# Patient Record
Sex: Female | Born: 1990 | Race: Black or African American | Hispanic: No | Marital: Single | Smoking: Current every day smoker
Health system: Southern US, Community
[De-identification: ages and names within clinical notes are randomized; demographics above are authoritative.]

## PROBLEM LIST (undated history)

## (undated) ENCOUNTER — Inpatient Hospital Stay (HOSPITAL_COMMUNITY): Payer: Self-pay

## (undated) DIAGNOSIS — F419 Anxiety disorder, unspecified: Secondary | ICD-10-CM

## (undated) DIAGNOSIS — Z91018 Allergy to other foods: Secondary | ICD-10-CM

## (undated) DIAGNOSIS — T7840XA Allergy, unspecified, initial encounter: Secondary | ICD-10-CM

## (undated) DIAGNOSIS — F32A Depression, unspecified: Secondary | ICD-10-CM

## (undated) DIAGNOSIS — J45909 Unspecified asthma, uncomplicated: Secondary | ICD-10-CM

## (undated) DIAGNOSIS — K219 Gastro-esophageal reflux disease without esophagitis: Secondary | ICD-10-CM

## (undated) HISTORY — DX: Allergy to other foods: Z91.018

## (undated) HISTORY — DX: Gastro-esophageal reflux disease without esophagitis: K21.9

## (undated) HISTORY — DX: Anxiety disorder, unspecified: F41.9

## (undated) HISTORY — DX: Depression, unspecified: F32.A

## (undated) HISTORY — DX: Allergy, unspecified, initial encounter: T78.40XA

---
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
## 2013-07-23 ENCOUNTER — Emergency Department (HOSPITAL_COMMUNITY)
Admission: EM | Admit: 2013-07-23 | Discharge: 2013-07-23 | Disposition: A | Discharge status: Home / Self Care | Payer: Self-pay | Attending: Emergency Medicine | Admitting: Emergency Medicine

## 2013-07-23 ENCOUNTER — Encounter (HOSPITAL_COMMUNITY): Payer: Self-pay | Admitting: Emergency Medicine

## 2013-07-23 DIAGNOSIS — Z79899 Other long term (current) drug therapy: Secondary | ICD-10-CM | POA: Insufficient documentation

## 2013-07-23 DIAGNOSIS — J029 Acute pharyngitis, unspecified: Secondary | ICD-10-CM | POA: Insufficient documentation

## 2013-07-23 LAB — RAPID STREP SCREEN (MED CTR MEBANE ONLY): Streptococcus, Group A Screen (Direct): NEGATIVE

## 2013-07-23 MED ORDER — IBUPROFEN 600 MG PO TABS: 600.0000 mg | ORAL_TABLET | Freq: Four times a day (QID) | ORAL | Status: DC | PRN

## 2013-07-23 MED ORDER — LIDOCAINE VISCOUS 2 % MT SOLN
15.0000 mL | Freq: Once | OROMUCOSAL | Status: AC
  Administered 2013-07-23: 15 mL via OROMUCOSAL
  Filled 2013-07-23: qty 15

## 2013-07-23 MED ORDER — LIDOCAINE VISCOUS 2 % MT SOLN: 20.0000 mL | OROMUCOSAL | Status: DC | PRN

## 2013-07-23 NOTE — Discharge Instructions (Signed)
Please follow up with your primary care physician in 1-2 days. If you do not have one please call the [facility] number listed above. Please alternate between Motrin and Tylenol every three hours for fevers and pain. Please read all discharge instructions and return precautions.   Viral Pharyngitis Viral pharyngitis is a viral infection that produces redness, pain, and swelling (inflammation) of the throat. It can spread from person to person (contagious). CAUSES Viral pharyngitis is caused by inhaling a large amount of certain germs called viruses. Many different viruses cause viral pharyngitis. SYMPTOMS Symptoms of viral pharyngitis include:  Sore throat.  Tiredness.  Stuffy nose.  Low-grade fever.  Congestion.  Cough. TREATMENT Treatment includes rest, drinking plenty of fluids, and the use of over-the-counter medication (approved by your caregiver). HOME CARE INSTRUCTIONS   Drink enough fluids to keep your urine clear or pale yellow.  Eat soft, cold foods such as ice cream, frozen ice pops, or gelatin dessert.  Gargle with warm salt water (1 tsp salt per 1 qt of water).  If over age 267, throat lozenges may be used safely.  Only take over-the-counter or prescription medicines for pain, discomfort, or fever as directed by your caregiver. Do not take aspirin. To help prevent spreading viral pharyngitis to others, avoid:  Mouth-to-mouth contact with others.  Sharing utensils for eating and drinking.  Coughing around others. SEEK MEDICAL CARE IF:   You are better in a few days, then become worse.  You have a fever or pain not helped by pain medicines.  There are any other changes that concern you. Document Released: 10/23/2004 Document Revised: 04/07/2011 Document Reviewed: 03/21/2010 [facility] Patient Information 2015 [place]. This information is not intended to replace advice given to you by your health care provider. Make sure you discuss  any questions you have with your health care provider.

## 2013-07-23 NOTE — ED Notes (Signed)
Pt presents to department for evaluation of sore throat x2 weeks. Respirations unlabored. NAD.

## 2013-07-23 NOTE — ED Provider Notes (Signed)
CSN: [id number]     Arrival date & time 07/23/13  1339 History  This chart was scribed for non-physician practitioner, Francee PiccoloJennifer Audria Takeshita, PA-C working with Richardean Canalavid H Yao, MD by Luisa DagoPriscilla Tutu, ED scribe. This patient was seen in room TR08C/TR08C and the patient's care was started at 3:40 PM.    Chief Complaint  Patient presents with  . Sore Throat    The history is provided by the patient. No language interpreter was used.   HPI Comments: Sharon Stone is a 23 y.o. female who presents to the Emergency Department complaining of a worsening sore throat that started 1.5 weeks ago. She is also complaining of associated rhinorrhea but she states that that is normal for her. Pt is also complaining of painful swallowing. She denies any abdominal pain, sick contact, congestion, fever, chills, nausea, or emesis.   History reviewed. No pertinent past medical history. History reviewed. No pertinent past surgical history. No family history on file. History  Substance Use Topics  . Smoking status: Never Smoker   . Smokeless tobacco: Not on file  . Alcohol Use: No   OB History   Grav Para Term Preterm Abortions TAB SAB Ect Mult Living                 Review of Systems  Constitutional: Negative for fever and chills.  HENT: Positive for rhinorrhea and sore throat. Negative for congestion.   Respiratory: Negative for cough and shortness of breath.   Cardiovascular: Negative for chest pain.  Gastrointestinal: Negative for nausea and vomiting.  All other systems reviewed and are negative.  Allergies  Shellfish allergy  Home Medications   Prior to Admission medications   Medication Sig Start Date End Date Taking? Authorizing Provider  acetaminophen (TYLENOL) 500 MG tablet Take 500 mg by mouth daily as needed for mild pain.   Yes Historical Provider, MD  Multiple Vitamins-Minerals (MULTIVITAMIN PO) Take 1 tablet by mouth daily.   Yes Historical Provider, MD  ibuprofen (ADVIL,MOTRIN) 600  MG tablet Take 1 tablet (600 mg total) by mouth every 6 (six) hours as needed. 07/23/13   Alliene Klugh L Lynnette Pote, PA-C  lidocaine (XYLOCAINE) 2 % solution Use as directed 20 mLs in the mouth or throat as needed for mouth pain. 07/23/13   Alesha Jaffee L Janesia Joswick, PA-C   BP 102/52  Pulse 106  Temp(Src) 98.2 F (36.8 C) (Oral)  Resp 18  SpO2 99%  Physical Exam  Nursing note and vitals reviewed. Constitutional: She is oriented to person, place, and time. She appears well-developed and well-nourished. No distress.  HENT:  Head: Normocephalic and atraumatic.  Right Ear: External ear normal.  Left Ear: External ear normal.  Nose: Nose normal.  Mouth/Throat: Uvula is midline and mucous membranes are normal. No trismus in the jaw. No uvula swelling. Oropharyngeal exudate and posterior oropharyngeal erythema present. No posterior oropharyngeal edema or tonsillar abscesses.  Eyes: Conjunctivae are normal.  Neck: Normal range of motion. Neck supple.  Cardiovascular: Normal rate, regular rhythm and normal heart sounds.   Pulmonary/Chest: Effort normal and breath sounds normal. No respiratory distress. She has no wheezes. She has no rales.  Abdominal: Soft. There is no tenderness.  Musculoskeletal: Normal range of motion.  Lymphadenopathy:    She has cervical adenopathy.  Neurological: She is alert and oriented to person, place, and time.  Skin: Skin is warm and dry. She is not diaphoretic.  Psychiatric: She has a normal mood and affect.    ED Course  Procedures (including  critical care time)  DIAGNOSTIC STUDIES: Oxygen Saturation is 99% on RA, normal by my interpretation.    COORDINATION OF CARE: 3:43 PM- Pt advised of plan for treatment and pt agrees.  Labs Review Labs Reviewed  RAPID STREP SCREEN  CULTURE, GROUP A STREP    Imaging Review No results found.   EKG Interpretation None      MDM   Final diagnoses:  Viral pharyngitis    Filed Vitals:   07/23/13 1601  BP:  116/64  Pulse: 90  Temp:   Resp:    Afebrile, NAD, non-toxic appearing, AAOx4.   Pt afebrile with tonsillar exudate, negative strep. Presents with mild cervical lymphadenopathy, & dysphagia; diagnosis of viral pharyngitis. No abx indicated. DC w symptomatic tx for pain  Pt does not appear dehydrated, but did discuss importance of water rehydration. Presentation non concerning for PTA or infxn spread to soft tissue. No trismus or uvula deviation. Specific return precautions discussed. Pt able to drink water in ED without difficulty with intact air way. Recommended PCP follow up. Patient is stable at time of discharge.   I personally performed the services described in this documentation, which was scribed in my presence. The recorded information has been reviewed and is accurate.    Jeannetta EllisJennifer L Eliyanna Ault, PA-C 07/23/13 1619

## 2013-07-25 LAB — CULTURE, GROUP A STREP

## 2013-07-26 NOTE — ED Provider Notes (Signed)
Medical screening examination/treatment/procedure(s) were performed by non-physician practitioner and as supervising physician I was immediately available for consultation/collaboration.   EKG Interpretation None        Richardean Canalavid H Yao, MD 07/26/13 [phone]

## 2013-11-25 ENCOUNTER — Encounter (HOSPITAL_COMMUNITY): Payer: Self-pay | Admitting: Emergency Medicine

## 2013-11-25 ENCOUNTER — Emergency Department (HOSPITAL_COMMUNITY)
Admission: EM | Admit: 2013-11-25 | Discharge: 2013-11-25 | Disposition: A | Discharge status: Home / Self Care | Payer: Self-pay | Attending: Emergency Medicine | Admitting: Emergency Medicine

## 2013-11-25 DIAGNOSIS — Z3202 Encounter for pregnancy test, result negative: Secondary | ICD-10-CM | POA: Insufficient documentation

## 2013-11-25 DIAGNOSIS — L0291 Cutaneous abscess, unspecified: Secondary | ICD-10-CM

## 2013-11-25 DIAGNOSIS — L0231 Cutaneous abscess of buttock: Secondary | ICD-10-CM | POA: Insufficient documentation

## 2013-11-25 LAB — POC URINE PREG, ED
Preg Test, Ur: NEGATIVE
Preg Test, Ur: NEGATIVE

## 2013-11-25 MED ORDER — HYDROCODONE-ACETAMINOPHEN 5-325 MG PO TABS: 1.0000 | ORAL_TABLET | Freq: Four times a day (QID) | ORAL | Status: DC | PRN

## 2013-11-25 MED ORDER — CEPHALEXIN 500 MG PO CAPS: 500.0000 mg | ORAL_CAPSULE | Freq: Four times a day (QID) | ORAL | Status: DC

## 2013-11-25 MED ORDER — LIDOCAINE HCL 2 % IJ SOLN
5.0000 mL | Freq: Once | INTRAMUSCULAR | Status: AC
  Administered 2013-11-25: 100 mg
  Filled 2013-11-25: qty 20

## 2013-11-25 NOTE — ED Notes (Signed)
Pt reports having abscess to buttock x 4 days that is causing pain. No acute distress noted at triage.

## 2013-11-25 NOTE — ED Notes (Addendum)
Pt noticed a bump on bottom in the shower. Pt states it is sore. Pt noticed it today in the mirror and it was bleeding. Abscess felt on right cheek. Hard upon palpation and pt states very tender to touch. Measured 4.5 cm by 3.5 cm. Small area draining.

## 2013-11-25 NOTE — Discharge Instructions (Signed)
Please call your doctor for a followup appointment within 24-48 hours. When you talk to your doctor please let them know that you were seen in the emergency department and have them acquire all of your records so that they can discuss the findings with you and formulate a treatment plan to fully care for your new and ongoing problems. Please rest and stay hydrated Please apply warm compressions to the site at least 4-5 times per day Please take antibiotics as prescribed and on a full stomach  Please take medications as prescribed - while on pain medications there is to be no drinking alcohol, driving, operating any heavy machinery. If extra please dispose in a proper manner. Please do not take any extra Tylenol with this medication for this can lead to Tylenol overdose and liver issues.  Please report back to the Emergency Department in 2 days for wound to be re-assessed Please continue to monitor symptoms closely and if symptoms are to worsen or change (fever greater than 101, chills, sweating, nausea, vomiting, chest pain, shortness of breathe, difficulty breathing, weakness, numbness, tingling, worsening or changes to pain pattern, red streaks running down the leg, increased pain, numbness, tingling, loss of sensation) please report back to the Emergency Department immediately.    Abscess An abscess is an infected area that contains a collection of pus and debris.It can occur in almost any part of the body. An abscess is also known as a furuncle or boil. CAUSES  An abscess occurs when tissue gets infected. This can occur from blockage of oil or sweat glands, infection of hair follicles, or a minor injury to the skin. As the body tries to fight the infection, pus collects in the area and creates pressure under the skin. This pressure causes pain. People with weakened immune systems have difficulty fighting infections and get certain abscesses more often.  SYMPTOMS Usually an abscess develops on the  skin and becomes a painful mass that is red, warm, and tender. If the abscess forms under the skin, you may feel a moveable soft area under the skin. Some abscesses break open (rupture) on their own, but most will continue to get worse without care. The infection can spread deeper into the body and eventually into the bloodstream, causing you to feel ill.  DIAGNOSIS  Your caregiver will take your medical history and perform a physical exam. A sample of fluid may also be taken from the abscess to determine what is causing your infection. TREATMENT  Your caregiver may prescribe antibiotic medicines to fight the infection. However, taking antibiotics alone usually does not cure an abscess. Your caregiver may need to make a small cut (incision) in the abscess to drain the pus. In some cases, gauze is packed into the abscess to reduce pain and to continue draining the area. HOME CARE INSTRUCTIONS   Only take over-the-counter or prescription medicines for pain, discomfort, or fever as directed by your caregiver.  If you were prescribed antibiotics, take them as directed. Finish them even if you start to feel better.  If gauze is used, follow your caregiver's directions for changing the gauze.  To avoid spreading the infection:  Keep your draining abscess covered with a bandage.  Wash your hands well.  Do not share personal care items, towels, or whirlpools with others.  Avoid skin contact with others.  Keep your skin and clothes clean around the abscess.  Keep all follow-up appointments as directed by your caregiver. SEEK MEDICAL CARE IF:   You  have increased pain, swelling, redness, fluid drainage, or bleeding.  You have muscle aches, chills, or a general ill feeling.  You have a fever. MAKE SURE YOU:   Understand these instructions.  Will watch your condition.  Will get help right away if you are not doing well or get worse. Document Released: 10/23/2004 Document Revised:  07/15/2011 Document Reviewed: 03/28/2011 [facility] Patient Information 2015 [place], [place]. This information is not intended to replace advice given to you by your health care provider. Make sure you discuss any questions you have with your health care provider.   Emergency Department Resource Guide 1) Find a Doctor and Pay Out of Pocket Although you won't have to find out who is covered by your insurance plan, it is a good idea to ask around and get recommendations. You will then need to call the office and see if the doctor you have chosen will accept you as a new patient and what types of options they offer for patients who are self-pay. Some doctors offer discounts or will set up payment plans for their patients who do not have insurance, but you will need to ask so you aren't surprised when you get to your appointment.  2) Contact Your Local Health Department Not all health departments have doctors that can see patients for sick visits, but many do, so it is worth a call to see if yours does. If you don't know where your local health department is, you can check in your phone book. The CDC also has a tool to help you locate your state's health department, and many state websites also have listings of all of their local health departments.  3) Find a Walk-in Clinic If your illness is not likely to be very severe or complicated, you may want to try a walk in clinic. These are popping up all over the country in pharmacies, drugstores, and shopping centers. They're usually staffed by nurse practitioners or physician assistants that have been trained to treat common illnesses and complaints. They're usually fairly quick and inexpensive. However, if you have serious medical issues or chronic medical problems, these are probably not your best option.  No Primary Care Doctor: - Call Health Connect at  [phone] - they can help you locate a primary care doctor that  accepts your insurance, provides certain  services, etc. - Physician Referral Service- [phone]  Chronic Pain Problems: Organization         Address  Phone   Notes  [facility]  [phone] Patients need to be referred by their primary care doctor.   Medication Assistance: Organization         Address  Phone   Notes  [facility] Medication [facility] [address] [place], [place] [address] [phone] --Must be a resident of [facility] [place] -- Must have NO insurance coverage whatsoever (no Medicaid/ Medicare, etc.) -- The pt. MUST have a primary care doctor that directs their care regularly and follows them in the community   Med[facility]ist  [phone]   [employer]  [phone]    Agencies that provide inexpensive medical care: Organization         Address  Phone   Notes  [facility]  [phone]   [facility]    [phone]   [facility] [address] [place], [place] [address] [phone]   [facility] [name],  [place] [phone]) [phone]   Planned Parenthood    [phone]) [phone]   [facility]    [phone]) [phone]   [facility] and [facility]  [address], [place] Phone:  [phone]) [phone], Fax:  [phone] Hours of Operation:  9 am - 6 pm, M-F.  Also accepts Medicaid/Medicare and self-pay.  [facility]  [address], [place] Phone: [phone], Fax: [phone]. Hours of Operation:  8:30 am - 5:30 pm, M-F.  Also accepts Medicaid and self-pay.  [facility] [place] [address], [place] Phone: [phone]) [phone]   [facility] [address], [place], [place] [phone], Ext. [phone] Mondays & Thursdays: 7-9 AM.  First 15 patients are seen on a first come, first serve basis.    Medicaid-accepting [facility] Providers:  Organization         Address  Phone   Notes  [facility] [address], [place] [phone] Also accepts self-pay patients.  [facility] [address] [place], [place]  [phone]) [phone]   [facility] [address], [place] [phone]) [phone]   [facility] [address] [place] [address], [place] [phone]   Renaye RakersVeita Bland [address], [place]   [phone] Only accepts [place] Access [place] patients after they have their name applied to their card.   Self-Pay (no insurance) in [facility]:  Organization         Address  Phone   Notes  Sickle Cell Patients, [facility] [address], [place] [phone]   [facility] [address], [place] [phone]   [facility] [place]  [address], [place] [phone]   [facility]/Dr. Osei-Bonsu  [address] [place] [address], [place] or [phone], [place] [phone] Phone number for both [place] and [place] locations is the same.  [facility] [address], [place] [phone]   [facility] [place] [address] [place] [address], [place] or [address] [phone]   [facility] [address] [place] [address], [place] [phone]) [phone], phone; [phone], fax Sees patients 1st and 3rd Saturday of every month.  Must not qualify for public or private insurance (i.e. Medicaid, Medicare, [place] Health Choice, Veterans' Benefits)  Household income should be no more than 200% of the poverty level The clinic cannot treat you if you are pregnant or think you are pregnant  Sexually transmitted diseases are not treated at the clinic.    Dental Care: Organization         Address  Phone  Notes  [facility] Department of [facility] [address], [place] [phone]) [phone] Accepts  children up to age 21 who are enrolled in [place] or [place] Health Choice; pregnant women with a Medicaid card; and children who have applied for Medicaid or [place] Health Choice, but were declined, whose parents can pay a reduced fee at time of service.  [facility] of [facility] [place]  [address], [place] [phone] Accepts children up to age 321 who are enrolled in [place] or [place] Health Choice; pregnant women with a Medicaid card; and children who have applied for Medicaid or   Health Choice, but were declined, whose parents can pay a reduced fee at time of service.  [facility]  [address] [place], [place] [phone] Patients are seen by appointment only. Walk-ins are not accepted. [facility] will see patients 69 years of age and older. Monday - Tuesday (8am-5pm) Most Wednesdays (8:30-5pm) $30 per visit, cash only  [facility] Access PRO[facility]  [address], [facility] [phone] Patients are seen by appointment only. Walk-ins are not accepted. [facility] will see patients 60 years of age and older. One Wednesday Evening (Monthly: Volunteer Based).  $30 per visit, cash only  [employer]  [phone] for adults; Children under age 23, call Graduate Pediatric Dentistry at [phone]. Children aged 11-14, please call [phone] to request a pediatric application.  Dental services are provided in all areas of dental care including fillings, crowns and bridges, complete and partial dentures, implants, gum treatment, root canals, and extractions. Preventive care is also provided. Treatment is provided to both adults and children. Patients are selected via a lottery and there is often a waiting list.   [facility] [address], [place]  [phone] [email or web address]   [facility] [address] [place], [place] [phone], Ext. [phone] Second and  Fourth Thursday of each month, opens at 6:30 AM; Clinic ends at 9 AM.  Patients are seen on a first-come first-served basis, and a limited number are seen during each clinic.   [facility]  [address] [place], [place] [phone]   Eligibility Requirements You must have lived in [place], [place], or [place] counties for at least the last three months.   You cannot be eligible for state or federal sponsored [place], including [employer], [place], or [employer].   You generally cannot be eligible for healthcare insurance through your employer.    How to apply: Eligibility screenings are held every Tuesday and Wednesday afternoon from 1:00 pm until 4:00 pm. You do not need an appointment for the interview!  [facility] [address] [place], [place] [phone]   [facility] Health Department  [phone]   [facility] Health Department  [phone]   [facility] Health Department  [phone]    Behavioral Health Resources in the Community: Intensive Outpatient Programs Organization         Address  Phone  Notes  [facility] [address] [place], [place] [phone]   [facility] Outpatient [address] [place], [place] [phone]   [facility]: Alcohol [facility] Drug Svcs [address] [place], [place]  [phone]   [facility] Mental Health [address],  [place], [place] [phone] or [phone]   Substance Abuse Resources Organization         Address  Phone  Notes  [facility] Drug Services  [phone]   [facility]  [phone]   [facility] [place]  [phone]   Floydene Flock  [phone]   Residential & Outpatient Substance Abuse Program  [phone]   Psychological Services Organization         Address  Phone  Notes  [facility] Health  [phone]   [facility]  [phone]   [facility] Mental Health  [address] [place] [phone] or [phone]    Mobile Crisis Teams Organization         Address  Phone  Notes  [facility], Mobile Crisis Care Unit  [phone]   [facility]  [address] [place], [place] [phone]   [facility] [address] [place] [phone]    Self-Help/Support Groups Organization         Address  Phone             Notes  [facility] of [place] - variety of support groups  [phone]- I7437963 Call for more information  Narcotics Anonymous (NA), [facility] [address], [employer] [place]  2 meetings at this location   Statistician         Address  Phone  Notes  [facility] [address],    [place]  [phone]   [facility]  [address] [place] [address], [place], [place] [phone]   [facility] [address] [place], [place] [phone] Admissions: 8am-3pm M-F  [facility] [address],    [place], [place] [phone]   [facility] [address] [place], [place], [place] [phone]   [facility] [address]  [place], [place] [phone]   [facility] - Intensive Outpatient [address], [place], [place] [phone]   [facility] ([facility] [address] [place].,  [place], [place] [phone] or [phone]   [facility] ([facility]) [address], [place], [place] [phone] Accepts Medicaid  [facility] [address],  [place] [phone] Substance Abuse/Addiction Treatment   [facility] Organization         Address  Phone  Notes  [facility]  [phone]   Angie Fava, PhD [address] [place], [place]   ([phone] or [phone]   [facility]al   [address] [place], [place] [phone]   [facility] [address] [place], [place] [phone] Insurance/Medicaid/sponsorship through [facility] and Families [address] [place] [address]                                    [place], [place] [phone] Therapy/tele-psych/case  [facility] [address], [place] [phone]    Dr. Lolly Mustache  [phone]   [facility] [place]  [employer] [facility] Dept. 1) [address], [place] 2) [address] [place] 3)  [address], [place] [phone]  [phone]   [facility] Child Abuse Hotline 310-879-2267 or [phone]) [phone] (After Hours)

## 2013-11-25 NOTE — ED Provider Notes (Signed)
Medical screening examination/treatment/procedure(s) were performed by non-physician practitioner and as supervising physician I was immediately available for consultation/collaboration.   EKG Interpretation None       Doug SouSam Katharin Schneider, MD 11/25/13 2259

## 2013-11-25 NOTE — ED Notes (Addendum)
PA at bedside draining abscess. Pt reports some relief.

## 2013-11-25 NOTE — ED Provider Notes (Signed)
CSN: [id number]     Arrival date & time 11/25/13  1528 History   First MD Initiated Contact with Patient 11/25/13 1609     Chief Complaint  Patient presents with  . Abscess     (Consider location/radiation/quality/duration/timing/severity/associated sxs/prior Treatment) Patient is a 23 y.o. female presenting with abscess. The history is provided by the patient. No language interpreter was used.  Abscess Associated symptoms: no fever, no headaches, no nausea and no vomiting   [name] is a 23 y/o F with no known significant PMHx presenting to the ED with abscess to the right buttock that started 4 days ago. Patient reported that there is tenderness upon palpation to the area along with sitting for long periods of time. Patient reported that the size of the abscess has stayed the same. Patient reported that she placed a hot rag on the site last evening for a couple of minutes. Reported that this afternoon she noticed drainage of blood and pus. Denied chest pain, shortness of breath, difficulty breathing, fever, chills, numbness, tingling, loss of sensation, neck pain, neck stiffness, pruritus, shaving.  PCP none  History reviewed. No pertinent past medical history. History reviewed. No pertinent past surgical history. History reviewed. No pertinent family history. History  Substance Use Topics  . Smoking status: Never Smoker   . Smokeless tobacco: Not on file  . Alcohol Use: No   OB History   Grav Para Term Preterm Abortions TAB SAB Ect Mult Living                 Review of Systems  Constitutional: Negative for fever and chills.  Respiratory: Negative for chest tightness and shortness of breath.   Cardiovascular: Negative for chest pain.  Gastrointestinal: Negative for nausea, vomiting, abdominal pain and diarrhea.  Musculoskeletal: Negative for back pain and neck pain.  Skin: Positive for wound (right buttock abscess).  Neurological: Negative for dizziness, weakness,  numbness and headaches.      Allergies  Shellfish allergy  Home Medications   Prior to Admission medications   Medication Sig Start Date End Date Taking? Authorizing Provider  acetaminophen (TYLENOL) 500 MG tablet Take 500 mg by mouth daily as needed for mild pain.   Yes Historical Provider, MD  ibuprofen (ADVIL,MOTRIN) 200 MG tablet Take 400 mg by mouth every 6 (six) hours as needed.   Yes Historical Provider, MD  cephALEXin (KEFLEX) 500 MG capsule Take 1 capsule (500 mg total) by mouth 4 (four) times daily. 11/25/13   Berk Pilot, PA-C   BP 107/77  Pulse 83  Temp(Src) 97.7 F (36.5 C) (Oral)  Resp 16  SpO2 100%  LMP 11/07/2013 Physical Exam  Nursing note and vitals reviewed. Constitutional: She is oriented to person, place, and time. She appears well-developed and well-nourished. No distress.  HENT:  Head: Normocephalic and atraumatic.  Eyes: Conjunctivae and EOM are normal. Pupils are equal, round, and reactive to light. Right eye exhibits no discharge. Left eye exhibits no discharge.  Neck: Normal range of motion. Neck supple. No tracheal deviation present.  Cardiovascular: Normal rate, regular rhythm and normal heart sounds.  Exam reveals no friction rub.   No murmur heard. Pulses:      Radial pulses are 2+ on the left side.  Pulmonary/Chest: Effort normal and breath sounds normal. No respiratory distress. She has no wheezes. She has no rales.  Musculoskeletal: Normal range of motion.  Lymphadenopathy:    She has no cervical adenopathy.  Neurological: She is alert and oriented  to person, place, and time. No cranial nerve deficit. She exhibits normal muscle tone. Coordination normal.  Skin: She is not diaphoretic. There is erythema.     Approximately 5 cm x 5 cm area of induration noted to the right buttock with erythema and warmth upon palpation. Active drainage of purulent and blood noted on exam - increased with pressure applied to this region. Tender upon  palpation. Negative red streaks.   Psychiatric: She has a normal mood and affect. Her behavior is normal. Thought content normal.    ED Course  Procedures (including critical care time)  INCISION AND DRAINAGE Performed by: Raymon MuttonSciacca, Jaiquan Temme Consent: Verbal consent obtained. Risks and benefits: risks, benefits and alternatives were discussed Type: abscess Body area: right buttock Anesthesia: local infiltration Incision was made with a scalpel. Local anesthetic: lidocaine 2% without epinephrine Anesthetic total: 5 ml Complexity: complex Blunt dissection to break up loculations Drainage: purulent and blood tinged Drainage amount: 10-15 cc Patient tolerance: Patient tolerated the procedure well with no immediate complications.  Results for orders placed during the hospital encounter of 11/25/13  POC URINE PREG, ED      Result Value Ref Range   Preg Test, Ur NEGATIVE  NEGATIVE    Labs Review Labs Reviewed  POC URINE PREG, ED    Imaging Review No results found.   EKG Interpretation None      MDM   Final diagnoses:  Abscess    Medications  lidocaine (XYLOCAINE) 2 % (with pres) injection 100 mg (100 mg Infiltration Given 11/25/13 1642)   Filed Vitals:   11/25/13 1630 11/25/13 1645 11/25/13 1645 11/25/13 1700  BP: 138/117 121/72 121/72 107/77  Pulse: 78 81 81 83  Temp:      TempSrc:      Resp:   18 16  SpO2: 100% 100% 100% 100%   Patient presenting to the emergency department with indurated abscess noted to the right buttock. Incision and drainage performed with positive release of purulent and blood-tinged drainage-patient responded to procedure well. Patient stable, afebrile. Patient on septic appearing. Discharged patient. Discharge patient with antibiotics and pain medications - discussed course precautions, disposal technique. Discussed with patient to rest and stay hydrated. Discussed with patient to apply warm compressions. Discussed with patient to report  back to the ED within 2 days for patient to be seen and re-assessed. Discussed with patient to closely monitor symptoms and if symptoms are to worsen or change to report back to the ED - strict return instructions given.  Patient agreed to plan of care, understood, all questions answered.   Raymon MuttonMarissa Gilad Dugger, PA-C 11/25/13 [phone]

## 2014-01-17 ENCOUNTER — Emergency Department (HOSPITAL_COMMUNITY)
Admission: EM | Admit: 2014-01-17 | Discharge: 2014-01-17 | Disposition: A | Discharge status: Home / Self Care | Payer: Self-pay | Attending: Emergency Medicine | Admitting: Emergency Medicine

## 2014-01-17 ENCOUNTER — Encounter (HOSPITAL_COMMUNITY): Payer: Self-pay | Admitting: Emergency Medicine

## 2014-01-17 DIAGNOSIS — J029 Acute pharyngitis, unspecified: Secondary | ICD-10-CM | POA: Insufficient documentation

## 2014-01-17 DIAGNOSIS — Z79899 Other long term (current) drug therapy: Secondary | ICD-10-CM | POA: Insufficient documentation

## 2014-01-17 LAB — RAPID STREP SCREEN (MED CTR MEBANE ONLY): Streptococcus, Group A Screen (Direct): NEGATIVE

## 2014-01-17 MED ORDER — SODIUM CHLORIDE 0.9 % IV BOLUS (SEPSIS)
1000.0000 mL | Freq: Once | INTRAVENOUS | Status: AC
  Administered 2014-01-17: 1000 mL via INTRAVENOUS

## 2014-01-17 NOTE — Discharge Instructions (Signed)
Read instructions below to learn more about your diagnosis and for reasons to return to the ED. Followup with your doctor if symptoms are not improving after 3-4 days.  If you do not have a doctor use the resource guide listed below to help he find one. You may return to the emergency department if symptoms worsen, become progressive, or become more concerning. Use cough drops, warm liquids, and throat numbing spray. Viral Pharyngitis  Viral pharyngitis is a viral infection that produces redness, pain, and swelling (inflammation) of the throat. Viral infections are not treated with antibiotics.  HOME CARE INSTRUCTIONS  Drink enough fluids to keep your urine clear or pale yellow.  Eat soft, cold foods such as ice cream, frozen ice pops, or gelatin dessert.  Gargle with warm salt water (1 tsp salt per 1 qt of water).  Throat lozenges  Use over the counter medications for symptomatic relief  (Tylenol for fever/pain, Motrin/Ibuprofen for swelling/pain)  To help prevent spreading viral pharyngitis to others, avoid:  Mouth-to-mouth contact with others.  Sharing utensils for eating and drinking.  Coughing around others.   SEEK IMMEDIATE MEDICAL CARE IF:  A rash develops.  Bloody substance is coughed up.  You are unable to swallow liquids or food for 24 hours.  You develop any new symptoms such as uncontrolable vomiting, severe headache, stiff neck, chest pain, or trouble breathing or swallowing.  You have severe throat pain along with drooling or voice changes.   You are unable to fully open the mouth.   RESOURCE GUIDE  Dental Problems  Patients with Medicaid: [facility]                     [facility] [phone]                                           [phone] Phone:  [phone]                                                  Phone:  [phone]  If unable to pay or uninsured, contact:  [facility] or [facility] to become qualified  for the adult dental clinic.  Chronic Pain Problems Contact [facility]  [phone] Patients need to be referred by their primary care doctor.  Insufficient Money for Medicine Contact [employer]:  call "211" or [facility] [phone].  No Primary Care Doctor Call [facility]  [phone] Other agencies that provide inexpensive medical care    [facility]  [phone]    [facility]  [phone]    [facility]  [phone]    [facility]  [phone]    [facility]  [phone]    [facility]  [phone]  Psychological Services [facility]  [phone] [facility]  [phone] [facility]   [phone] (emergency services [phone])  Substance Abuse Resources [facility]  [phone] [facility] [phone] [facility] [phone] [facility] [phone] Residential & Outpatient Substance Abuse Program  [phone]  Abuse/Neglect [facility] Hot[facility] [phone] [facility] Hot[facility] (620)220-4185(820)543-5618 (After Hours)  [facility] [phone]  [facility] [phone] [facility] [phone]  MRSA Hotline #:   863-210-7487(562) 214-3475    [facility]  [facility]     [employer]                          [facility] [address] [place]                       [address]      [address]  [place]                                                [place]                            [place] Phone:  [phone]                                   Phone:  [phone]                 Phone:  [phone]  [facility] Phone:  [phone]  [facility] Hotline [phone]) 477-2818(336) 6602808358 [phone] (After Hours)

## 2014-01-17 NOTE — ED Notes (Signed)
Pt. Stated, Lamona CurlA'vre been sick since Sunday with body aches, sore throat, and feeling bad.

## 2014-01-17 NOTE — ED Provider Notes (Signed)
CSN: [id number]     Arrival date & time 01/17/14  1149 History   First MD Initiated Contact with Patient 01/17/14 1241     Chief Complaint  Patient presents with  . Generalized Body Aches  . Sore Throat     (Consider location/radiation/quality/duration/timing/severity/associated sxs/prior Treatment) Patient is a 23 y.o. female presenting with pharyngitis. The history is provided by the patient.  Sore Throat This is a new problem. The current episode started in the past 7 days. The problem occurs constantly. The problem has been unchanged. Associated symptoms include arthralgias, chills, congestion, fatigue, myalgias, a sore throat and swollen glands. Pertinent negatives include no abdominal pain, anorexia, change in bowel habit, chest pain, coughing, diaphoresis, fever, headaches, joint swelling, nausea, neck pain, numbness, rash, urinary symptoms, vertigo, visual change, vomiting or weakness. The symptoms are aggravated by swallowing. She has tried acetaminophen for the symptoms. The treatment provided moderate relief.    History reviewed. No pertinent past medical history. History reviewed. No pertinent past surgical history. No family history on file. History  Substance Use Topics  . Smoking status: Never Smoker   . Smokeless tobacco: Not on file  . Alcohol Use: No   OB History    No data available     Review of Systems  Constitutional: Positive for chills and fatigue. Negative for fever and diaphoresis.  HENT: Positive for congestion and sore throat.   Respiratory: Negative for cough.   Cardiovascular: Negative for chest pain.  Gastrointestinal: Negative for nausea, vomiting, abdominal pain, anorexia and change in bowel habit.  Musculoskeletal: Positive for myalgias and arthralgias. Negative for joint swelling and neck pain.  Skin: Negative for rash.  Neurological: Negative for vertigo, weakness, numbness and headaches.  All other systems reviewed and are  negative.     Allergies  Shellfish allergy  Home Medications   Prior to Admission medications   Medication Sig Start Date End Date Taking? Authorizing Provider  acetaminophen (TYLENOL) 500 MG tablet Take 500 mg by mouth daily as needed for mild pain.   Yes Historical Provider, MD  albuterol (PROVENTIL HFA;VENTOLIN HFA) 108 (90 BASE) MCG/ACT inhaler Inhale 1-2 puffs into the lungs every 6 (six) hours as needed for wheezing or shortness of breath.   Yes Historical Provider, MD  ibuprofen (ADVIL,MOTRIN) 200 MG tablet Take 400 mg by mouth every 6 (six) hours as needed.   Yes Historical Provider, MD  cephALEXin (KEFLEX) 500 MG capsule Take 1 capsule (500 mg total) by mouth 4 (four) times daily. Patient not taking: Reported on 01/17/2014 11/25/13   Marissa Sciacca, PA-C  HYDROcodone-acetaminophen (NORCO/VICODIN) 5-325 MG per tablet Take 1 tablet by mouth every 6 (six) hours as needed for moderate pain or severe pain. Patient not taking: Reported on 01/17/2014 11/25/13   Marissa Sciacca, PA-C   BP 98/64 mmHg  Pulse 126  Temp(Src) 98.2 F (36.8 C) (Oral)  Resp 17  Ht 5\' 5"  (1.651 m)  Wt 268 lb 9 oz (121.819 kg)  BMI 44.69 kg/m2  SpO2 98%  LMP 01/12/2014 Physical Exam  Constitutional: She is oriented to person, place, and time. She appears well-developed and well-nourished. No distress.  HENT:  Head: Normocephalic and atraumatic.  Mouth/Throat: Uvula is midline. No trismus in the jaw. No uvula swelling. Posterior oropharyngeal edema and posterior oropharyngeal erythema present. No oropharyngeal exudate or tonsillar abscesses.    Eyes: Conjunctivae are normal. No scleral icterus.  Neck: Normal range of motion.  Cardiovascular: Normal rate, regular rhythm and normal heart sounds.  Exam reveals no gallop and no friction rub.   No murmur heard. Pulmonary/Chest: Effort normal and breath sounds normal. No respiratory distress.  Abdominal: Soft. Bowel sounds are normal. She exhibits no  distension and no mass. There is no tenderness. There is no guarding.  Neurological: She is alert and oriented to person, place, and time.  Skin: Skin is warm and dry. She is not diaphoretic.  Vitals reviewed.   ED Course  Procedures (including critical care time) Labs Review Labs Reviewed  RAPID STREP SCREEN    Imaging Review No results found.   EKG Interpretation None      MDM   Final diagnoses:  Viral pharyngitis  BP 98/64 mmHg  Pulse 126  Temp(Src) 98.2 F (36.8 C) (Oral)  Resp 17  Ht 5\' 5"  (1.651 m)  Wt 268 lb 9 oz (121.819 kg)  BMI 44.69 kg/m2  SpO2 98%  LMP 01/12/2014 Patietn with Sore throat. Tolerating PO. Initials VS show tachycardia and hypotension.  Afebrile and pulse in the 90s on my assessment.  2:05 PM BP 109/65 mmHg  Pulse 90  Temp(Src) 98 F (36.7 C) (Oral)  Resp 18  Ht 5\' 5"  (1.651 m)  Wt 268 lb 9 oz (121.819 kg)  BMI 44.69 kg/m2  SpO2 100%  LMP 01/12/2014 Pressure and pulse improved. There is negative rapid strep. Pt afebrile without tonsillar exudate, negative strep. Presents with mild cervical lymphadenopathy, & dysphagia; diagnosis of viral pharyngitis. No abx indicated. DC w symptomatic tx for pain  Pt does not appear dehydrated, but did discuss importance of water rehydration. Presentation non concerning for PTA or infxn spread to soft tissue. No trismus or uvula deviation. Specific return precautions discussed. Pt able to drink water in ED without difficulty with intact air way. Recommended PCP follow up.   Arthor Captainbigail Lakindra Wible, PA-C 01/17/14 1423  Juliet RudeNathan R. Rubin PayorPickering, MD 01/17/14 1549

## 2014-01-19 LAB — CULTURE, GROUP A STREP

## 2014-03-26 ENCOUNTER — Encounter (HOSPITAL_COMMUNITY): Payer: Self-pay

## 2014-03-26 ENCOUNTER — Inpatient Hospital Stay (HOSPITAL_COMMUNITY): Payer: Self-pay

## 2014-03-26 ENCOUNTER — Inpatient Hospital Stay (HOSPITAL_COMMUNITY)
Admission: AD | Admit: 2014-03-26 | Discharge: 2014-03-26 | Disposition: A | Discharge status: Home / Self Care | Payer: Self-pay | Source: Ambulatory Visit | Attending: Obstetrics & Gynecology | Admitting: Obstetrics & Gynecology

## 2014-03-26 DIAGNOSIS — O210 Mild hyperemesis gravidarum: Secondary | ICD-10-CM | POA: Insufficient documentation

## 2014-03-26 DIAGNOSIS — F17210 Nicotine dependence, cigarettes, uncomplicated: Secondary | ICD-10-CM | POA: Insufficient documentation

## 2014-03-26 DIAGNOSIS — O26899 Other specified pregnancy related conditions, unspecified trimester: Secondary | ICD-10-CM

## 2014-03-26 DIAGNOSIS — R109 Unspecified abdominal pain: Secondary | ICD-10-CM

## 2014-03-26 DIAGNOSIS — O99331 Smoking (tobacco) complicating pregnancy, first trimester: Secondary | ICD-10-CM | POA: Insufficient documentation

## 2014-03-26 DIAGNOSIS — Z3A01 Less than 8 weeks gestation of pregnancy: Secondary | ICD-10-CM | POA: Insufficient documentation

## 2014-03-26 DIAGNOSIS — O219 Vomiting of pregnancy, unspecified: Secondary | ICD-10-CM

## 2014-03-26 HISTORY — DX: Unspecified asthma, uncomplicated: J45.909

## 2014-03-26 LAB — URINALYSIS, ROUTINE W REFLEX MICROSCOPIC
GLUCOSE, UA: NEGATIVE mg/dL
Hgb urine dipstick: NEGATIVE
Ketones, ur: 15 mg/dL — AB
NITRITE: NEGATIVE
PROTEIN: 100 mg/dL — AB
Urobilinogen, UA: 0.2 mg/dL (ref 0.0–1.0)
pH: 6 (ref 5.0–8.0)

## 2014-03-26 LAB — CBC
HCT: 33.3 % — ABNORMAL LOW (ref 36.0–46.0)
Hemoglobin: 11 g/dL — ABNORMAL LOW (ref 12.0–15.0)
MCH: 25.6 pg — ABNORMAL LOW (ref 26.0–34.0)
MCHC: 33 g/dL (ref 30.0–36.0)
MCV: 77.6 fL — ABNORMAL LOW (ref 78.0–100.0)
Platelets: 134 10*3/uL — ABNORMAL LOW (ref 150–400)
RBC: 4.29 MIL/uL (ref 3.87–5.11)
RDW: 16.5 % — ABNORMAL HIGH (ref 11.5–15.5)
WBC: 4.5 10*3/uL (ref 4.0–10.5)

## 2014-03-26 LAB — WET PREP, GENITAL
Trich, Wet Prep: NONE SEEN
Yeast Wet Prep HPF POC: NONE SEEN

## 2014-03-26 LAB — URINE MICROSCOPIC-ADD ON

## 2014-03-26 LAB — HCG, QUANTITATIVE, PREGNANCY: hCG, Beta Chain, Quant, S: 9927 m[IU]/mL — ABNORMAL HIGH (ref <5)

## 2014-03-26 LAB — ABO/RH: ABO/RH(D): O POS

## 2014-03-26 LAB — POCT PREGNANCY, URINE: Preg Test, Ur: POSITIVE — AB

## 2014-03-26 MED ORDER — PROMETHAZINE HCL 25 MG PO TABS: 25.0000 mg | ORAL_TABLET | Freq: Four times a day (QID) | ORAL | Status: DC | PRN

## 2014-03-26 MED ORDER — PROMETHAZINE HCL 25 MG/ML IJ SOLN
25.0000 mg | Freq: Once | INTRAMUSCULAR | Status: AC
  Administered 2014-03-26: 25 mg via INTRAVENOUS
  Filled 2014-03-26: qty 1

## 2014-03-26 NOTE — MAU Provider Note (Signed)
History     CSN: [id number]  Arrival date and time: 03/26/14 1625   First Provider Initiated Contact with Patient 03/26/14 1646      Chief Complaint  Patient presents with  . Morning Sickness   HPI  Sharon Stone is a 24 y.o. G1P1001 at 5 1/[redacted] wks EGA. She presents c/o N&V x 2-3d she vomits ~ 4x/d, every time she tries to eat. She also has low ML heavy, pressure, cramping. No spotting, no change in discharge, odor or itching. No UTI S&S. She has heartburn off/on.  OB History    Gravida Para Term Preterm AB TAB SAB Ectopic Multiple Living   Past Medical History  Diagnosis Date  . Asthma     History reviewed. No pertinent past surgical history.  History reviewed. No pertinent family history.  History  Substance Use Topics  . Smoking status: Current Every Day Smoker    Types: Cigarettes  . Smokeless tobacco: Not on file  . Alcohol Use: Yes    Allergies:  Allergies  Allergen Reactions  . Shellfish Allergy Anaphylaxis    Prescriptions prior to admission  Medication Sig Dispense Refill Last Dose  . acetaminophen (TYLENOL) 500 MG tablet Take 500 mg by mouth daily as needed for mild pain.   Past Month at Unknown time  . albuterol (PROVENTIL HFA;VENTOLIN HFA) 108 (90 BASE) MCG/ACT inhaler Inhale 1-2 puffs into the lungs every 6 (six) hours as needed for wheezing or shortness of breath.   unknown  . cephALEXin (KEFLEX) 500 MG capsule Take 1 capsule (500 mg total) by mouth 4 (four) times daily. (Patient not taking: Reported on 01/17/2014) 20 capsule 0 Completed Course at Unknown time  . HYDROcodone-acetaminophen (NORCO/VICODIN) 5-325 MG per tablet Take 1 tablet by mouth every 6 (six) hours as needed for moderate pain or severe pain. (Patient not taking: Reported on 01/17/2014) 5 tablet 0 Completed Course at Unknown time  . ibuprofen (ADVIL,MOTRIN) 200 MG tablet Take 400 mg by mouth every 6 (six) hours as needed.   Past Month at Unknown time    Review  of Systems  Constitutional: Negative for fever and chills.  Gastrointestinal: Positive for heartburn, nausea, vomiting and abdominal pain. Negative for diarrhea and constipation.  Genitourinary: Negative for dysuria, urgency and frequency.       Denies discharge and UTI S&S   Physical Exam   Blood pressure 134/69, pulse 106, temperature 98 F (36.7 C), temperature source Oral, resp. rate 18, last menstrual period 02/18/2014.  Physical Exam  Nursing note and vitals reviewed. Constitutional: She is oriented to person, place, and time. She appears well-developed and well-nourished.  GI: Soft. There is no tenderness. There is no guarding.  Genitourinary:  Pelvic exam- Ext gen- nl anatomy, skin intact Vagina- small amt thin white discharge with odor Cx- parous Uterus- nl size, non tender Adn- non tender, no masses palp  Musculoskeletal: Normal range of motion.  Neurological: She is alert and oriented to person, place, and time.  Skin: Skin is warm and dry.  Psychiatric: She has a normal mood and affect. Her behavior is normal.    MAU Course  Procedures  MDM Results for orders placed or performed during the hospital encounter of 03/26/14 (from the past 24 hour(s))  Urinalysis, Routine w reflex microscopic     Status: Abnormal   Collection Time: 03/26/14  4:30 PM  Result Value Ref Range   Color,  Urine YELLOW YELLOW   APPearance HAZY (A) CLEAR   Specific Gravity, Urine >1.030 (H) 1.005 - 1.030   pH 6.0 5.0 - 8.0   Glucose, UA NEGATIVE NEGATIVE mg/dL   Hgb urine dipstick NEGATIVE NEGATIVE   Bilirubin Urine SMALL (A) NEGATIVE   Ketones, ur 15 (A) NEGATIVE mg/dL   Protein, ur 161100 (A) NEGATIVE mg/dL   Urobilinogen, UA 0.2 0.0 - 1.0 mg/dL   Nitrite NEGATIVE NEGATIVE   Leukocytes, UA TRACE (A) NEGATIVE  Urine microscopic-add on     Status: Abnormal   Collection Time: 03/26/14  4:30 PM  Result Value Ref Range   Squamous Epithelial / LPF MANY (A) RARE   WBC, UA 7-10 <3 WBC/hpf    RBC / HPF 0-2 <3 RBC/hpf   Bacteria, UA MANY (A) RARE  Pregnancy, urine POC     Status: Abnormal   Collection Time: 03/26/14  4:49 PM  Result Value Ref Range   Preg Test, Ur POSITIVE (A) NEGATIVE  Wet prep, genital     Status: Abnormal   Collection Time: 03/26/14  4:55 PM  Result Value Ref Range   Yeast Wet Prep HPF POC NONE SEEN NONE SEEN   Trich, Wet Prep NONE SEEN NONE SEEN   Clue Cells Wet Prep HPF POC FEW (A) NONE SEEN   WBC, Wet Prep HPF POC FEW (A) NONE SEEN  CBC     Status: Abnormal   Collection Time: 03/26/14  5:15 PM  Result Value Ref Range   WBC 4.5 4.0 - 10.5 K/uL   RBC 4.29 3.87 - 5.11 MIL/uL   Hemoglobin 11.0 (L) 12.0 - 15.0 g/dL   HCT 09.633.3 (L) 04.536.0 - 40.946.0 %   MCV 77.6 (L) 78.0 - 100.0 fL   MCH 25.6 (L) 26.0 - 34.0 pg   MCHC 33.0 30.0 - 36.0 g/dL   RDW 81.116.5 (H) 91.411.5 - 78.215.5 %   Platelets 134 (L) 150 - 400 K/uL  hCG, quantitative, pregnancy     Status: Abnormal   Collection Time: 03/26/14  5:15 PM  Result Value Ref Range   hCG, Beta Chain, Quant, S 9927 (H) <5 mIU/mL  ABO/Rh     Status: None (Preliminary result)   Collection Time: 03/26/14  5:15 PM  Result Value Ref Range   ABO/RH(D) O POS    [place] Ob Comp Less 14 Wks  03/26/2014   CLINICAL DATA:  Acute onset of abdominal cramping and vomiting. Initial encounter.  EXAM: OBSTETRIC <14 WK US AND TRANSVAGINAL OB US  TECHNIQUE: Both transabdominal and transvaginal ultrasound examinations were performed for complete evaluation of the gestation as well as the maternal uterus, adnexal regions, and pelvic cul-de-sac. Transvaginal technique was performed to assess early pregnancy.  COMPARISON:  None.  FINDINGS: Intrauterine gestational sac: Visualized/normal in shape.  Yolk sac:  Yes  Embryo:  No  Cardiac Activity: N/A  MSD: 8.4 mm   5 w   3  d  Maternal uterus/adnexae: No subchorionic hemorrhage is noted. The uterus is otherwise unremarkable in appearance  The ovaries are within normal limits. The right ovary measures 4.4 x 3.3 x  2.8 cm, while the left ovary measures 3.6 x 1.7 x 2.1 cm. No suspicious adnexal masses are seen; there is no evidence for ovarian torsion.  No free fluid is seen within the pelvic cul-de-sac.  IMPRESSION: Single intrauterine gestational sac noted, measuring 8 mm in mean sac diameter, reflecting an estimated delivery of 5 weeks 3 days. This matches the gestational age  of [redacted] weeks 1 day by LMP, reflecting an estimated date of delivery of November 25, 2014. The embryo is not yet seen, within normal limits given the size of the gestational sac. A yolk sac is visualized.   Electronically Signed   By: Roanna Raider M.D.   On: 03/26/2014 19:38   US Ob Transvaginal  03/26/2014   CLINICAL DATA:  Acute onset of abdominal cramping and vomiting. Initial encounter.  EXAM: OBSTETRIC <14 WK [place] AND TRANSVAGINAL OB US  TECHNIQUE: Both transabdominal and transvaginal ultrasound examinations were performed for complete evaluation of the gestation as well as the maternal uterus, adnexal regions, and pelvic cul-de-sac. Transvaginal technique was performed to assess early pregnancy.  COMPARISON:  None.  FINDINGS: Intrauterine gestational sac: Visualized/normal in shape.  Yolk sac:  Yes  Embryo:  No  Cardiac Activity: N/A  MSD: 8.4 mm   5 w   3  d  Maternal uterus/adnexae: No subchorionic hemorrhage is noted. The uterus is otherwise unremarkable in appearance  The ovaries are within normal limits. The right ovary measures 4.4 x 3.3 x 2.8 cm, while the left ovary measures 3.6 x 1.7 x 2.1 cm. No suspicious adnexal masses are seen; there is no evidence for ovarian torsion.  No free fluid is seen within the pelvic cul-de-sac.  IMPRESSION: Single intrauterine gestational sac noted, measuring 8 mm in mean sac diameter, reflecting an estimated delivery of 5 weeks 3 days. This matches the gestational age of [redacted] weeks 1 day by LMP, reflecting an estimated date of delivery of November 25, 2014. The embryo is not yet seen, within normal limits given  the size of the gestational sac. A yolk sac is visualized.   Electronically Signed   By: Roanna Raider M.D.   On: 03/26/2014 19:38     Assessment and Plan  1- 5 3/7 wks IUGS with yolk sac 2- Morning sickness- better after IV fluids with Phenergan-management reviewed, Rx Phenergan 3-Urine for C&S 4-Encouraged to make an appt for prenatal care  Promise Bushong M. 03/26/2014, 5:03 PM

## 2014-03-26 NOTE — MAU Note (Signed)
Pt presents complaining of nausea and vomiting x2 days. States she isn't eating food but is able to keep water and tea down. Also having abdominal pain. Denies vaginal bleeding or discharge. +HPT Thursday of this week

## 2014-03-27 LAB — HIV ANTIBODY (ROUTINE TESTING W REFLEX): HIV Screen 4th Generation wRfx: NONREACTIVE

## 2014-03-27 LAB — GC/CHLAMYDIA PROBE AMP (~~LOC~~) NOT AT ARMC
Chlamydia: NEGATIVE
Neisseria Gonorrhea: NEGATIVE

## 2014-03-28 LAB — CULTURE, OB URINE
Colony Count: 75000
Special Requests: NORMAL

## 2014-04-07 ENCOUNTER — Inpatient Hospital Stay (HOSPITAL_COMMUNITY)
Admission: AD | Admit: 2014-04-07 | Discharge: 2014-04-07 | Disposition: A | Discharge status: Home / Self Care | Payer: BLUE CROSS/BLUE SHIELD | Source: Ambulatory Visit | Attending: Family Medicine | Admitting: Family Medicine

## 2014-04-07 ENCOUNTER — Encounter (HOSPITAL_COMMUNITY): Payer: Self-pay | Admitting: *Deleted

## 2014-04-07 DIAGNOSIS — O23591 Infection of other part of genital tract in pregnancy, first trimester: Secondary | ICD-10-CM | POA: Insufficient documentation

## 2014-04-07 DIAGNOSIS — B9689 Other specified bacterial agents as the cause of diseases classified elsewhere: Secondary | ICD-10-CM

## 2014-04-07 DIAGNOSIS — R109 Unspecified abdominal pain: Secondary | ICD-10-CM | POA: Diagnosis present

## 2014-04-07 DIAGNOSIS — O200 Threatened abortion: Secondary | ICD-10-CM

## 2014-04-07 DIAGNOSIS — Z3A01 Less than 8 weeks gestation of pregnancy: Secondary | ICD-10-CM | POA: Diagnosis not present

## 2014-04-07 DIAGNOSIS — N760 Acute vaginitis: Secondary | ICD-10-CM | POA: Diagnosis not present

## 2014-04-07 DIAGNOSIS — O99331 Smoking (tobacco) complicating pregnancy, first trimester: Secondary | ICD-10-CM | POA: Diagnosis not present

## 2014-04-07 DIAGNOSIS — F17210 Nicotine dependence, cigarettes, uncomplicated: Secondary | ICD-10-CM | POA: Diagnosis not present

## 2014-04-07 LAB — URINALYSIS, ROUTINE W REFLEX MICROSCOPIC
BILIRUBIN URINE: NEGATIVE
GLUCOSE, UA: NEGATIVE mg/dL
HGB URINE DIPSTICK: NEGATIVE
Ketones, ur: NEGATIVE mg/dL
Leukocytes, UA: NEGATIVE
NITRITE: NEGATIVE
PH: 8.5 — AB (ref 5.0–8.0)
Protein, ur: NEGATIVE mg/dL
Specific Gravity, Urine: 1.02 (ref 1.005–1.030)
Urobilinogen, UA: 1 mg/dL (ref 0.0–1.0)

## 2014-04-07 LAB — WET PREP, GENITAL
TRICH WET PREP: NONE SEEN
Yeast Wet Prep HPF POC: NONE SEEN

## 2014-04-07 MED ORDER — METRONIDAZOLE 500 MG PO TABS: 500.0000 mg | ORAL_TABLET | Freq: Two times a day (BID) | ORAL | Status: DC

## 2014-04-07 NOTE — MAU Note (Signed)
Pt presents complaining of lower abdominal pain and occasional spotting. States it's been going on for a week or more. Denies other vaginal discharge.

## 2014-04-07 NOTE — Discharge Instructions (Signed)
[facility]    [facility]  [phone]     Phone: [phone]          [facility]                      [facility]  @[place]     Phone: [phone]  Phone: [phone]        [facility]     Phone: [phone]  Phone: [phone]          [facility] [place]                Phone: [phone]  Phone: [phone]        [facility]         Phone: [phone]          [facility]                Phone: [phone]  [facility]   [phone]    Family [address] [place])          Phone: [phone] [facility]   Phone: [phone]  Bacterial Vaginosis Bacterial vaginosis is a vaginal infection that occurs when the normal balance of bacteria in the vagina is disrupted. It results from an overgrowth of certain bacteria. This is the most common vaginal infection in women of childbearing age. Treatment is important to prevent complications, especially in pregnant women, as it can cause a premature delivery. CAUSES  Bacterial vaginosis is caused by an increase in harmful bacteria that are normally present in smaller amounts in the vagina. Several different kinds of bacteria can cause bacterial vaginosis. However, the reason that the condition develops is not fully understood. RISK FACTORS Certain activities or behaviors can put you at an increased risk of developing bacterial vaginosis, including:  Having a new sex partner or multiple sex partners.  Douching.  Using an intrauterine device (IUD) for contraception. Women do not get bacterial vaginosis from toilet seats, bedding, swimming pools, or contact with objects around them. SIGNS AND SYMPTOMS  Some women with bacterial vaginosis have no signs or symptoms. Common symptoms include:  Grey  vaginal discharge.  A fishlike odor with discharge, especially after sexual intercourse.  Itching or burning of the vagina and vulva.  Burning or pain with urination. DIAGNOSIS  Your health care provider will take a medical history and examine the vagina for signs of bacterial vaginosis. A sample of vaginal fluid may be taken. Your health care provider will look at this sample under a microscope to check for bacteria and abnormal cells. A vaginal pH test may also be done.  TREATMENT  Bacterial vaginosis may be treated with antibiotic medicines. These may be given in the form of a pill or a vaginal cream. A second round of antibiotics may be prescribed if the condition comes back after treatment.  HOME CARE INSTRUCTIONS   Only take over-the-counter or prescription medicines as directed by your health care provider.  If antibiotic medicine was prescribed, take it as directed. Make sure you finish it even if you start to feel better.  Do not have sex until treatment is completed.  Tell all sexual partners that you have a vaginal infection. They should see their health care provider and be treated if they have problems, such as a mild rash  or itching.  Practice safe sex by using condoms and only having one sex partner. SEEK MEDICAL CARE IF:   Your symptoms are not improving after 3 days of treatment.  You have increased discharge or pain.  You have a fever. MAKE SURE YOU:   Understand these instructions.  Will watch your condition.  Will get help right away if you are not doing well or get worse. FOR MORE INFORMATION  Centers for Disease Control and Prevention, Division of STD Prevention: SolutionApps.co.za American Sexual Health Association (ASHA): www.ashastd.org  Document Released: 01/13/2005 Document Revised: 11/03/2012 Document Reviewed: 08/25/2012 [facility] Patient Information 2015 [place], [place]. This information is not intended to replace advice given to you by your health  care provider. Make sure you discuss any questions you have with your health care provider. Prenatal Care  WHAT IS PRENATAL CARE?  Prenatal care means health care during your pregnancy, before your baby is born. It is very important to take care of yourself and your baby during your pregnancy by:   Getting early prenatal care. If you know you are pregnant, or think you might be pregnant, call your health care provider as soon as possible. Schedule a visit for a prenatal exam.  Getting regular prenatal care. Follow your health care provider's schedule for blood and other necessary tests. Do not miss appointments.  Doing everything you can to keep yourself and your baby healthy during your pregnancy.  Getting complete care. Prenatal care should include evaluation of the medical, dietary, educational, psychological, and social needs of you and your significant other. The medical and genetic history of your family and the family of your baby's father should be discussed with your health care provider.  Discussing with your health care provider:  Prescription, over-the-counter, and herbal medicines that you take.  Any history of substance abuse, alcohol use, smoking, and illegal drug use.  Any history of domestic abuse and violence.  Immunizations you have received.  Your nutrition and diet.  The amount of exercise you do.  Any environmental and occupational hazards to which you are exposed.  History of sexually transmitted infections for both you and your partner.  Previous pregnancies you have had. WHY IS PRENATAL CARE SO IMPORTANT?  By regularly seeing your health care provider, you help ensure that problems can be identified early so that they can be treated as soon as possible. Other problems might be prevented. Many studies have shown that early and regular prenatal care is important for the health of mothers and their babies.  HOW CAN I TAKE CARE OF MYSELF WHILE I AM PREGNANT?    Here are ways to take care of yourself and your baby:   Start or continue taking your multivitamin with 400 micrograms (mcg) of folic acid every day.  Get early and regular prenatal care. It is very important to see a health care provider during your pregnancy. Your health care provider will check at each visit to make sure that you and your baby are healthy. If there are any problems, action can be taken right away to help you and your baby.  Eat a healthy diet that includes:  Fruits.  Vegetables.  Foods low in saturated fat.  Whole grains.  Calcium-rich foods, such as milk, yogurt, and hard cheeses.  Drink 6-8 glasses of liquids a day.  Unless your health care provider tells you not to, try to be physically active for 30 minutes, most days of the week. If you are pressed for time, you  can get your activity in through 10-minute segments, three times a day.  Do not smoke, drink alcohol, or use drugs. These can cause long-term damage to your baby. Talk with your health care provider about steps to take to stop smoking. Talk with a member of your faith community, a counselor, a trusted friend, or your health care provider if you are concerned about your alcohol or drug use.  Ask your health care provider before taking any medicine, even over-the-counter medicines. Some medicines are not safe to take during pregnancy.  Get plenty of rest and sleep.  Avoid hot tubs and saunas during pregnancy.  Do not have X-rays taken unless absolutely necessary and with the recommendation of your health care provider. A lead shield can be placed on your abdomen to protect your baby when X-rays are taken in other parts of your body.  Do not empty the cat litter when you are pregnant. It may contain a parasite that causes an infection called toxoplasmosis, which can cause birth defects. Also, use gloves when working in garden areas used by cats.  Do not eat uncooked or undercooked meats or fish.  Do  not eat soft, mold-ripened cheeses (Brie, Camembert, and chevre) or soft, blue-veined cheese (Danish blue and Roquefort).  Stay away from toxic chemicals like:  Insecticides.  Solvents (some cleaners or paint thinners).  Lead.  Mercury.  Sexual intercourse may continue until the end of the pregnancy, unless you have a medical problem or there is a problem with the pregnancy and your health care provider tells you not to.  Do not wear high-heel shoes, especially during the second half of the pregnancy. You can lose your balance and fall.  Do not take long trips, unless absolutely necessary. Be sure to see your health care provider before going on the trip.  Do not sit in one position for more than 2 hours when on a trip.  Take a copy of your medical records when going on a trip. Know where a hospital is located in the city you are visiting, in case of an emergency.  Most dangerous household products will have pregnancy warnings on their labels. Ask your health care provider about products if you are unsure.  Limit or eliminate your caffeine intake from coffee, tea, sodas, medicines, and chocolate.  Many women continue working through pregnancy. Staying active might help you stay healthier. If you have a question about the safety or the hours you work at your particular job, talk with your health care provider.  Get informed:  Read books.  Watch videos.  Go to childbirth classes for you and your significant other.  Talk with experienced moms.  Ask your health care provider about childbirth education classes for you and your partner. Classes can help you and your partner prepare for the birth of your baby.  Ask about a baby doctor (pediatrician) and methods and pain medicine for labor, delivery, and possible cesarean delivery. HOW OFTEN SHOULD I SEE MY HEALTH CARE PROVIDER DURING PREGNANCY?  Your health care provider will give you a schedule for your prenatal visits. You will  have visits more often as you get closer to the end of your pregnancy. An average pregnancy lasts about 40 weeks.  A typical schedule includes visiting your health care provider:   About once each month during your first 6 months of pregnancy.  Every 2 weeks during the next 2 months.  Weekly in the last month, until the delivery date. Your health care  provider will probably want to see you more often if:  You are older than 35 years.  Your pregnancy is high risk because you have certain health problems or problems with the pregnancy, such as:  Diabetes.  High blood pressure.  The baby is not growing on schedule, according to the dates of the pregnancy. Your health care provider will do special tests to make sure you and your baby are not having any serious problems. WHAT HAPPENS DURING PRENATAL VISITS?   At your first prenatal visit, your health care provider will do a physical exam and talk to you about your health history and the health history of your partner and your family. Your health care provider will be able to tell you what date to expect your baby to be born on.  Your first physical exam will include checks of your blood pressure, measurements of your height and weight, and an exam of your pelvic organs. Your health care provider will do a Pap test if you have not had one recently and will do cultures of your cervix to make sure there is no infection.  At each prenatal visit, there will be tests of your blood, urine, blood pressure, weight, and the progress of the baby will be checked.  At your later prenatal visits, your health care provider will check how you are doing and how your baby is developing. You may have a number of tests done as your pregnancy progresses.  Ultrasound exams are often used to check on your baby's growth and health.  You may have more urine and blood tests, as well as special tests, if needed. These may include amniocentesis to examine fluid in  the pregnancy sac, stress tests to check how the baby responds to contractions, or a biophysical profile to measure your baby's well-being. Your health care provider will explain the tests and why they are necessary.  You should be tested for high blood sugar (gestational diabetes) between the 24th and 28th weeks of your pregnancy.  You should discuss with your health care provider your plans to breastfeed or bottle-feed your baby.  Each visit is also a chance for you to learn about staying healthy during pregnancy and to ask questions. Document Released: 01/16/2003 Document Revised: 01/18/2013 Document Reviewed: 03/30/2013 [facility] Patient Information 2015 [place], [place]. This information is not intended to replace advice given to you by your health care provider. Make sure you discuss any questions you have with your health care provider.

## 2014-04-07 NOTE — MAU Provider Note (Signed)
History     CSN: [id number]  Arrival date and time: 04/07/14 1642   First Provider Initiated Contact with Patient 04/07/14 1719      Chief Complaint  Patient presents with  . Abdominal Pain  . Vaginal Bleeding   HPI Sharon Stone 24 y.o. G2P1001 @[redacted]w[redacted]d  presents to MAU complaining of 2 days of pain and vaginal bleeding.  Pain is 7/10, feels like a cramping pain, pulsating pain located in the middle lower abdomen.  Last sex was over a week ago.  Denies vaginal discharge, weakness, fever, dysuria.  Has had nausea and vomiting.   OB History    Gravida Para Term Preterm AB TAB SAB Ectopic Multiple Living   2 1 1       1       Past Medical History  Diagnosis Date  . Asthma     Past Surgical History  Procedure Laterality Date  . No past surgeries      No family history on file.  History  Substance Use Topics  . Smoking status: Current Some Day Smoker    Types: Cigarettes  . Smokeless tobacco: Not on file  . Alcohol Use: No    Allergies:  Allergies  Allergen Reactions  . Shellfish Allergy Anaphylaxis    Prescriptions prior to admission  Medication Sig Dispense Refill Last Dose  . acetaminophen (TYLENOL) 500 MG tablet Take 500 mg by mouth daily as needed for mild pain.   PRN  . albuterol (PROVENTIL HFA;VENTOLIN HFA) 108 (90 BASE) MCG/ACT inhaler Inhale 1-2 puffs into the lungs every 6 (six) hours as needed for wheezing or shortness of breath.   PRN  . Prenatal Vit-Fe Fumarate-FA (PRENATAL MULTIVITAMIN) TABS tablet Take 1 tablet by mouth daily.   03/25/2014 at Unknown time  . promethazine (PHENERGAN) 25 MG tablet Take 1 tablet (25 mg total) by mouth every 6 (six) hours as needed for nausea or vomiting. 20 tablet 0     ROS Pertinent ROS in HPI  Physical Exam   Blood pressure 125/70, pulse 86, temperature 98 F (36.7 C), temperature source Oral, resp. rate 18, height 5\' 5"  (1.651 m), weight 250 lb 6.4 oz (113.581 kg), last menstrual period 02/18/2014.  Physical  Exam  Constitutional: She is oriented to person, place, and time. She appears well-developed and well-nourished. No distress.  HENT:  Head: Normocephalic and atraumatic.  Eyes: EOM are normal.  Neck: Normal range of motion.  Cardiovascular: Normal rate, regular rhythm and normal heart sounds.   Respiratory: Effort normal and breath sounds normal. No respiratory distress.  GI: Soft. Bowel sounds are normal. She exhibits no distension. There is no tenderness. There is no rebound and no guarding.  Genitourinary:  Small amt of white, malodorous, homogenous discharge present in vaginal vault.  Cervix is smooth, pink with nothing present in os.  Cervix is closed.  No CMT.  No adnexal mass or tenderness.   No evidence of bleeding.  Musculoskeletal: Normal range of motion.  Neurological: She is alert and oriented to person, place, and time.  Skin: Skin is warm and dry.  Psychiatric: She has a normal mood and affect.    MAU Course  Procedures  Results for orders placed or performed during the hospital encounter of 04/07/14 (from the past 24 hour(s))  Urinalysis, Routine w reflex microscopic     Status: Abnormal   Collection Time: 04/07/14  4:53 PM  Result Value Ref Range   Color, Urine YELLOW YELLOW   APPearance HAZY (A) CLEAR  Specific Gravity, Urine 1.020 1.005 - 1.030   pH 8.5 (H) 5.0 - 8.0   Glucose, UA NEGATIVE NEGATIVE mg/dL   Hgb urine dipstick NEGATIVE NEGATIVE   Bilirubin Urine NEGATIVE NEGATIVE   Ketones, ur NEGATIVE NEGATIVE mg/dL   Protein, ur NEGATIVE NEGATIVE mg/dL   Urobilinogen, UA 1.0 0.0 - 1.0 mg/dL   Nitrite NEGATIVE NEGATIVE   Leukocytes, UA NEGATIVE NEGATIVE  Wet prep, genital     Status: Abnormal   Collection Time: 04/07/14  5:40 PM  Result Value Ref Range   Yeast Wet Prep HPF POC NONE SEEN NONE SEEN   Trich, Wet Prep NONE SEEN NONE SEEN   Clue Cells Wet Prep HPF POC FEW (A) NONE SEEN   WBC, Wet Prep HPF POC FEW (A) NONE SEEN     MDM Exam and wet prep  consistent with BV.  No concern for ectopic as yolk sac visualized on u/s 2/28.  NO bleeding seen.    Assessment and Plan  A:  1. Bacterial vaginosis   2. Bloody show and cramping in early pregnancy      P: Discharge to home Flagyl x 1 week No etoh/IC x 10 days Obtain [facility] asap.   Patient may return to MAU as needed or if her condition were to change or worsen    Bertram Denver 04/07/2014, 5:20 PM

## 2014-04-24 ENCOUNTER — Other Ambulatory Visit (HOSPITAL_COMMUNITY): Payer: Self-pay | Admitting: Urology

## 2014-04-24 DIAGNOSIS — Z3682 Encounter for antenatal screening for nuchal translucency: Secondary | ICD-10-CM

## 2014-04-24 LAB — OB RESULTS CONSOLE GC/CHLAMYDIA
Chlamydia: NEGATIVE
GC PROBE AMP, GENITAL: NEGATIVE

## 2014-04-24 LAB — OB RESULTS CONSOLE RUBELLA ANTIBODY, IGM: Rubella: IMMUNE

## 2014-04-24 LAB — OB RESULTS CONSOLE ABO/RH: RH Type: POSITIVE

## 2014-04-24 LAB — OB RESULTS CONSOLE HEPATITIS B SURFACE ANTIGEN: Hepatitis B Surface Ag: NEGATIVE

## 2014-04-24 LAB — OB RESULTS CONSOLE ANTIBODY SCREEN: Antibody Screen: NEGATIVE

## 2014-04-24 LAB — OB RESULTS CONSOLE RPR: RPR: NONREACTIVE

## 2014-04-24 LAB — OB RESULTS CONSOLE HIV ANTIBODY (ROUTINE TESTING): HIV: NONREACTIVE

## 2014-05-16 ENCOUNTER — Other Ambulatory Visit (HOSPITAL_COMMUNITY): Payer: Self-pay | Admitting: Physician Assistant

## 2014-05-16 DIAGNOSIS — Z3682 Encounter for antenatal screening for nuchal translucency: Secondary | ICD-10-CM

## 2014-05-17 ENCOUNTER — Ambulatory Visit (HOSPITAL_COMMUNITY)
Admission: RE | Admit: 2014-05-17 | Discharge: 2014-05-17 | Disposition: A | Discharge status: Home / Self Care | Payer: BLUE CROSS/BLUE SHIELD | Source: Ambulatory Visit | Attending: Physician Assistant | Admitting: Physician Assistant

## 2014-05-17 ENCOUNTER — Ambulatory Visit (HOSPITAL_COMMUNITY): Admission: RE | Admit: 2014-05-17 | Payer: Self-pay | Source: Ambulatory Visit

## 2014-05-17 ENCOUNTER — Other Ambulatory Visit (HOSPITAL_COMMUNITY): Payer: Self-pay

## 2014-05-17 ENCOUNTER — Encounter (HOSPITAL_COMMUNITY): Payer: Self-pay

## 2014-05-17 DIAGNOSIS — O99331 Smoking (tobacco) complicating pregnancy, first trimester: Secondary | ICD-10-CM | POA: Diagnosis not present

## 2014-05-17 DIAGNOSIS — Z3682 Encounter for antenatal screening for nuchal translucency: Secondary | ICD-10-CM | POA: Insufficient documentation

## 2014-05-17 DIAGNOSIS — Z3A12 12 weeks gestation of pregnancy: Secondary | ICD-10-CM | POA: Insufficient documentation

## 2014-05-17 DIAGNOSIS — Z36 Encounter for antenatal screening of mother: Secondary | ICD-10-CM | POA: Insufficient documentation

## 2014-05-17 DIAGNOSIS — F17210 Nicotine dependence, cigarettes, uncomplicated: Secondary | ICD-10-CM | POA: Diagnosis not present

## 2014-05-23 ENCOUNTER — Other Ambulatory Visit (HOSPITAL_COMMUNITY): Payer: Self-pay | Admitting: Physician Assistant

## 2014-05-25 ENCOUNTER — Other Ambulatory Visit (HOSPITAL_COMMUNITY): Payer: Self-pay | Admitting: Nurse Practitioner

## 2014-05-25 DIAGNOSIS — Z3689 Encounter for other specified antenatal screening: Secondary | ICD-10-CM

## 2014-05-26 ENCOUNTER — Other Ambulatory Visit (HOSPITAL_COMMUNITY): Payer: Self-pay | Admitting: Nurse Practitioner

## 2014-05-26 DIAGNOSIS — Z3689 Encounter for other specified antenatal screening: Secondary | ICD-10-CM

## 2014-06-03 ENCOUNTER — Emergency Department (HOSPITAL_COMMUNITY)
Admission: EM | Admit: 2014-06-03 | Discharge: 2014-06-03 | Disposition: A | Discharge status: Home / Self Care | Payer: BLUE CROSS/BLUE SHIELD | Attending: Emergency Medicine | Admitting: Emergency Medicine

## 2014-06-03 ENCOUNTER — Encounter (HOSPITAL_COMMUNITY): Payer: Self-pay | Admitting: *Deleted

## 2014-06-03 DIAGNOSIS — J45909 Unspecified asthma, uncomplicated: Secondary | ICD-10-CM | POA: Insufficient documentation

## 2014-06-03 DIAGNOSIS — G8929 Other chronic pain: Secondary | ICD-10-CM | POA: Diagnosis not present

## 2014-06-03 DIAGNOSIS — F17210 Nicotine dependence, cigarettes, uncomplicated: Secondary | ICD-10-CM | POA: Diagnosis not present

## 2014-06-03 DIAGNOSIS — O99512 Diseases of the respiratory system complicating pregnancy, second trimester: Secondary | ICD-10-CM | POA: Insufficient documentation

## 2014-06-03 DIAGNOSIS — O9989 Other specified diseases and conditions complicating pregnancy, childbirth and the puerperium: Secondary | ICD-10-CM | POA: Insufficient documentation

## 2014-06-03 DIAGNOSIS — O99332 Smoking (tobacco) complicating pregnancy, second trimester: Secondary | ICD-10-CM | POA: Insufficient documentation

## 2014-06-03 DIAGNOSIS — Z3A15 15 weeks gestation of pregnancy: Secondary | ICD-10-CM | POA: Insufficient documentation

## 2014-06-03 DIAGNOSIS — M25562 Pain in left knee: Secondary | ICD-10-CM | POA: Diagnosis not present

## 2014-06-03 DIAGNOSIS — O99352 Diseases of the nervous system complicating pregnancy, second trimester: Secondary | ICD-10-CM | POA: Insufficient documentation

## 2014-06-03 NOTE — Discharge Instructions (Signed)

## 2014-06-03 NOTE — ED Notes (Signed)
The pt is c/o lt knee pain since January worse  Since last month.  Intermittent swelling.  She is [redacted] weeks pregnant

## 2014-06-03 NOTE — ED Provider Notes (Signed)
CSN: [id number]     Arrival date & time 06/03/14  1618 History  This chart was scribed for a non-[id number] practitioner, Roxy Horsemanobert Clemence Lengyel, PA-C working with Donnetta HutchingBrian Cook, MD by [place] Peace, ED Scribe. The patient was seen in TR07C/TR07C. The patient's care was started at 4:58 PM.    Chief Complaint  Patient presents with  . Knee Pain      The history is provided by the patient. No language interpreter was used.  HPI Comments: Sharon Stone is a 24 y.o. female who is [redacted] weeks pregnant presents to the Emergency Department complaining of progressively worsening left knee pain x 1 month with intermittent swelling. She states pain is exacerbated with bearing weight and ambulation. No complaints of fever, chills, nausea, or vomiting. She denies being on any long plane rides or car rides recently. No history of blood clots. Pt is current smoker. Denies any mechanism of injury.   Past Medical History  Diagnosis Date  . Asthma   . Pregnant    Past Surgical History  Procedure Laterality Date  . No past surgeries     No family history on file. History  Substance Use Topics  . Smoking status: Current Some Day Smoker    Types: Cigarettes  . Smokeless tobacco: Not on file  . Alcohol Use: No   OB History    Gravida Para Term Preterm AB TAB SAB Ectopic Multiple Living   2 1 1       1      Review of Systems  Constitutional: Negative for fever and chills.  Respiratory: Negative for shortness of breath.   Cardiovascular: Negative for chest pain.  Gastrointestinal: Negative for nausea, vomiting, diarrhea and constipation.  Genitourinary: Negative for dysuria.  Musculoskeletal:       Left knee pain.       Allergies  Shellfish allergy  Home Medications   Prior to Admission medications   Medication Sig Start Date End Date Taking? Authorizing Provider  acetaminophen (TYLENOL) 500 MG tablet Take 500 mg by mouth daily as needed for mild pain.    Historical Provider, MD  albuterol  (PROVENTIL HFA;VENTOLIN HFA) 108 (90 BASE) MCG/ACT inhaler Inhale 1-2 puffs into the lungs every 6 (six) hours as needed for wheezing or shortness of breath.    Historical Provider, MD  metroNIDAZOLE (FLAGYL) 500 MG tablet Take 1 tablet (500 mg total) by mouth 2 (two) times daily. Patient not taking: Reported on 05/17/2014 04/07/14   Bertram DenverKaren E Teague Clark, PA-C  Prenatal Vit-Fe Fumarate-FA (PRENATAL MULTIVITAMIN) TABS tablet Take 1 tablet by mouth daily.    Historical Provider, MD  promethazine (PHENERGAN) 25 MG tablet Take 1 tablet (25 mg total) by mouth every 6 (six) hours as needed for nausea or vomiting. Patient not taking: Reported on 05/17/2014 03/26/14   Rodell PernaKathy M Harris, NP   BP 102/51 mmHg  Pulse 114  Temp(Src) 98.4 F (36.9 C) (Oral)  Resp 14  SpO2 98%  LMP 02/18/2014 Physical Exam  Constitutional: She is oriented to person, place, and time. She appears well-developed and well-nourished. No distress.  HENT:  Head: Normocephalic and atraumatic.  Eyes: Conjunctivae and EOM are normal.  Neck: Neck supple. No tracheal deviation present.  Cardiovascular: Normal rate.   Pulmonary/Chest: Effort normal. No respiratory distress.  Musculoskeletal: Normal range of motion.  Left knee mildly tender to palpation over the lateral joint lines, no obvious bony abnormality or deformity, no swelling or effusion, no erythema or sign of septic joint or DVT, no  calf tenderness joint stability testing limited by body habitus  Neurological: She is alert and oriented to person, place, and time.  Skin: Skin is warm and dry.  Psychiatric: She has a normal mood and affect. Her behavior is normal.  Nursing note and vitals reviewed.   ED Course  Procedures (including critical care time) Labs Review Labs Reviewed - No data to display  Imaging Review No results found.   EKG Interpretation None     Medications - No data to display  5:02 PM- Treatment plan was discussed with patient who verbalizes  understanding and agrees.   MDM   Final diagnoses:  Knee pain, chronic, left    Patient with chronic left knee pain. Recommend orthopedic follow-up. Patient is ambulatory. Clears [place] knee rules, no indication for emergent imaging. No known mechanism of injury, though I suspect that under conditioning and body habitus may plane issue. Recommend follow-up with orthopedics.  I personally performed the services described in this documentation, which was scribed in my presence. The recorded information has been reviewed and is accurate.     Roxy Horsemanobert Milta Croson, PA-C 06/03/14 1709  Donnetta HutchingBrian Cook, MD 06/04/14 2325

## 2014-06-15 ENCOUNTER — Encounter (HOSPITAL_COMMUNITY): Payer: Self-pay

## 2014-07-03 ENCOUNTER — Other Ambulatory Visit (HOSPITAL_COMMUNITY): Payer: Self-pay | Admitting: Nurse Practitioner

## 2014-07-03 ENCOUNTER — Ambulatory Visit (HOSPITAL_COMMUNITY): Payer: Self-pay

## 2014-07-03 ENCOUNTER — Ambulatory Visit (HOSPITAL_COMMUNITY)
Admission: RE | Admit: 2014-07-03 | Discharge: 2014-07-03 | Disposition: A | Discharge status: Home / Self Care | Payer: BLUE CROSS/BLUE SHIELD | Source: Ambulatory Visit | Attending: Nurse Practitioner | Admitting: Nurse Practitioner

## 2014-07-03 DIAGNOSIS — Z3689 Encounter for other specified antenatal screening: Secondary | ICD-10-CM

## 2014-07-03 DIAGNOSIS — Z36 Encounter for antenatal screening of mother: Secondary | ICD-10-CM | POA: Diagnosis not present

## 2014-07-04 DIAGNOSIS — O99210 Obesity complicating pregnancy, unspecified trimester: Secondary | ICD-10-CM

## 2014-07-04 DIAGNOSIS — Z3689 Encounter for other specified antenatal screening: Secondary | ICD-10-CM | POA: Insufficient documentation

## 2014-07-04 DIAGNOSIS — Z3A19 19 weeks gestation of pregnancy: Secondary | ICD-10-CM | POA: Insufficient documentation

## 2014-08-08 ENCOUNTER — Other Ambulatory Visit (HOSPITAL_COMMUNITY): Payer: Self-pay | Admitting: Nurse Practitioner

## 2014-08-08 DIAGNOSIS — Z0489 Encounter for examination and observation for other specified reasons: Secondary | ICD-10-CM

## 2014-08-08 DIAGNOSIS — IMO0002 Reserved for concepts with insufficient information to code with codable children: Secondary | ICD-10-CM

## 2014-08-23 ENCOUNTER — Ambulatory Visit (HOSPITAL_COMMUNITY)
Admission: RE | Admit: 2014-08-23 | Discharge: 2014-08-23 | Disposition: A | Discharge status: Home / Self Care | Payer: BLUE CROSS/BLUE SHIELD | Source: Ambulatory Visit | Attending: Nurse Practitioner | Admitting: Nurse Practitioner

## 2014-08-23 DIAGNOSIS — IMO0002 Reserved for concepts with insufficient information to code with codable children: Secondary | ICD-10-CM

## 2014-08-23 DIAGNOSIS — Z36 Encounter for antenatal screening of mother: Secondary | ICD-10-CM | POA: Insufficient documentation

## 2014-08-23 DIAGNOSIS — Z3A26 26 weeks gestation of pregnancy: Secondary | ICD-10-CM | POA: Insufficient documentation

## 2014-08-23 DIAGNOSIS — O99330 Smoking (tobacco) complicating pregnancy, unspecified trimester: Secondary | ICD-10-CM | POA: Insufficient documentation

## 2014-08-23 DIAGNOSIS — Z0489 Encounter for examination and observation for other specified reasons: Secondary | ICD-10-CM | POA: Insufficient documentation

## 2014-10-20 ENCOUNTER — Emergency Department (HOSPITAL_COMMUNITY)
Admission: EM | Admit: 2014-10-20 | Discharge: 2014-10-20 | Disposition: A | Discharge status: Home / Self Care | Payer: BLUE CROSS/BLUE SHIELD | Attending: Emergency Medicine | Admitting: Emergency Medicine

## 2014-10-20 ENCOUNTER — Encounter (HOSPITAL_COMMUNITY): Payer: Self-pay | Admitting: *Deleted

## 2014-10-20 DIAGNOSIS — Z79899 Other long term (current) drug therapy: Secondary | ICD-10-CM | POA: Diagnosis not present

## 2014-10-20 DIAGNOSIS — H9203 Otalgia, bilateral: Secondary | ICD-10-CM | POA: Diagnosis present

## 2014-10-20 DIAGNOSIS — H6123 Impacted cerumen, bilateral: Secondary | ICD-10-CM | POA: Diagnosis not present

## 2014-10-20 DIAGNOSIS — Z720 Tobacco use: Secondary | ICD-10-CM | POA: Diagnosis not present

## 2014-10-20 DIAGNOSIS — J45909 Unspecified asthma, uncomplicated: Secondary | ICD-10-CM | POA: Diagnosis not present

## 2014-10-20 MED ORDER — CARBAMIDE PEROXIDE 6.5 % OT SOLN: 5.0000 [drp] | Freq: Two times a day (BID) | OTIC | Status: AC

## 2014-10-20 NOTE — Discharge Instructions (Signed)
Cerumen Impaction °A cerumen impaction is when the wax in your ear forms a plug. This plug usually causes reduced hearing. Sometimes it also causes an earache or dizziness. Removing a cerumen impaction can be difficult and painful. The wax sticks to the ear canal. The canal is sensitive and bleeds easily. If you try to remove a heavy wax buildup with a cotton tipped swab, you may push it in further. °Irrigation with water, suction, and small ear curettes may be used to clear out the wax. If the impaction is fixed to the skin in the ear canal, ear drops may be needed for a few days to loosen the wax. People who build up a lot of wax frequently can use ear wax removal products available in your local drugstore. °SEEK MEDICAL CARE IF:  °You develop an earache, increased hearing loss, or marked dizziness. °Document Released: 02/21/2004 Document Revised: 04/07/2011 Document Reviewed: 04/12/2009 °ExitCare® Patient Information ©2015 [employer]. This information is not intended to replace advice given to you by your health care provider. Make sure you discuss any questions you have with your health care provider. ° °

## 2014-10-20 NOTE — ED Notes (Signed)
Pt reports left ear pain and drainage for 2 weeks.

## 2014-10-20 NOTE — ED Provider Notes (Signed)
CSN: [id number]     Arrival date & time 10/20/14  1151 History  This chart was scribed for Fayrene Helper, PA-C, working with Mirian Mo, MD by Elon Spanner, ED Scribe. This patient was seen in room TR06C/TR06C and the patient's care was started at 1:14 PM.   Chief Complaint  Patient presents with  . Otalgia   The history is provided by the patient. No language interpreter was used.   HPI Comments: Sharon Stone is a 24 y.o. female who presents to the Emergency Department complaining of mild, waxing and waning left ear pain and slightly decreased hearing on the left.  Associated symptoms include very mild sore throat onset several days ago.  Patient reports using the "end of a cone" for itching in the ear but has inserted no other foreign bodies.  She denies fever, rhinorrhea, tinnitus, other hearing changes.    Past Medical History  Diagnosis Date  . Asthma   . Pregnant    Past Surgical History  Procedure Laterality Date  . No past surgeries     No family history on file. Social History  Substance Use Topics  . Smoking status: Current Some Day Smoker    Types: Cigarettes  . Smokeless tobacco: None  . Alcohol Use: No   OB History    Gravida Para Term Preterm AB TAB SAB Ectopic Multiple Living   Review of Systems  Constitutional: Negative for fever.  HENT: Positive for ear pain.       Allergies  Shellfish allergy  Home Medications   Prior to Admission medications   Medication Sig Start Date End Date Taking? Authorizing Provider  acetaminophen (TYLENOL) 500 MG tablet Take 500 mg by mouth daily as needed for mild pain.    Historical Provider, MD  albuterol (PROVENTIL HFA;VENTOLIN HFA) 108 (90 BASE) MCG/ACT inhaler Inhale 1-2 puffs into the lungs every 6 (six) hours as needed for wheezing or shortness of breath.    Historical Provider, MD  metroNIDAZOLE (FLAGYL) 500 MG tablet Take 1 tablet (500 mg total) by mouth 2 (two) times daily. Patient not  taking: Reported on 05/17/2014 04/07/14   Bertram Denver, PA-C  Prenatal Vit-Fe Fumarate-FA (PRENATAL MULTIVITAMIN) TABS tablet Take 1 tablet by mouth daily.    Historical Provider, MD  promethazine (PHENERGAN) 25 MG tablet Take 1 tablet (25 mg total) by mouth every 6 (six) hours as needed for nausea or vomiting. Patient not taking: Reported on 05/17/2014 03/26/14   Rodell Perna, NP   BP 115/58 mmHg  Pulse 92  Temp(Src) 97.7 F (36.5 C) (Oral)  Resp 18  SpO2 99%  LMP 02/18/2014 Physical Exam  Constitutional: She is oriented to person, place, and time. She appears well-developed and well-nourished. No distress.  HENT:  Head: Normocephalic and atraumatic.  Cerumen impaction to both ears, left > right.  No pain with manipulations of the tragus.  No signs of mastoiditis.  No TMJ>.  Nares normal.   Throat: uvula is midline with no tonsillar enlargement or exudate.   Eyes: Conjunctivae and EOM are normal.  Neck: Neck supple. No tracheal deviation present.  Cardiovascular: Normal rate.   Pulmonary/Chest: Effort normal. No respiratory distress.  Musculoskeletal: Normal range of motion.  Neurological: She is alert and oriented to person, place, and time.  Skin: Skin is warm and dry.  Psychiatric: She has a normal mood and affect. Her behavior is normal.  Nursing  note and vitals reviewed.   ED Course  Procedures (including critical care time)  DIAGNOSTIC STUDIES: Oxygen Saturation is 99% on RA, normal by my interpretation.    COORDINATION OF CARE:  1:17 PM Discussed plans to perform cerumen impaction.  Patient acknowledges and agrees with plan.    2:21 PM Cerumen removal performed using ear curette.  Able to remove a moderate amount of cerumen without causing any trauma. Pt felt better.  Recommend using hydrogen peroxide mixed with water 1:1 ratio daily to help decrease cerumen build up.      MDM   Final diagnoses:  Cerumen impaction, bilateral    BP 115/58 mmHg  Pulse  92  Temp(Src) 97.7 F (36.5 C) (Oral)  Resp 18  SpO2 99%  LMP 02/18/2014   I personally performed the services described in this documentation, which was scribed in my presence. The recorded information has been reviewed and is accurate.     Fayrene Helper, PA-C 10/20/14 1423  Mirian Mo, MD 10/21/14 [phone]

## 2014-10-30 LAB — OB RESULTS CONSOLE GBS: GBS: NEGATIVE

## 2014-10-31 ENCOUNTER — Other Ambulatory Visit (HOSPITAL_COMMUNITY): Payer: Self-pay | Admitting: Nurse Practitioner

## 2014-10-31 DIAGNOSIS — F17210 Nicotine dependence, cigarettes, uncomplicated: Secondary | ICD-10-CM

## 2014-10-31 DIAGNOSIS — Z3A37 37 weeks gestation of pregnancy: Secondary | ICD-10-CM

## 2014-10-31 DIAGNOSIS — O99213 Obesity complicating pregnancy, third trimester: Secondary | ICD-10-CM

## 2014-11-06 ENCOUNTER — Ambulatory Visit (HOSPITAL_COMMUNITY)
Admission: RE | Admit: 2014-11-06 | Discharge: 2014-11-06 | Disposition: A | Discharge status: Home / Self Care | Payer: BLUE CROSS/BLUE SHIELD | Source: Ambulatory Visit | Attending: Physician Assistant | Admitting: Physician Assistant

## 2014-11-06 DIAGNOSIS — E669 Obesity, unspecified: Secondary | ICD-10-CM | POA: Diagnosis not present

## 2014-11-06 DIAGNOSIS — O99213 Obesity complicating pregnancy, third trimester: Secondary | ICD-10-CM | POA: Diagnosis present

## 2014-11-06 DIAGNOSIS — F17210 Nicotine dependence, cigarettes, uncomplicated: Secondary | ICD-10-CM | POA: Insufficient documentation

## 2014-11-06 DIAGNOSIS — O99333 Smoking (tobacco) complicating pregnancy, third trimester: Secondary | ICD-10-CM | POA: Diagnosis not present

## 2014-11-06 DIAGNOSIS — Z3A37 37 weeks gestation of pregnancy: Secondary | ICD-10-CM | POA: Diagnosis not present

## 2014-11-26 ENCOUNTER — Inpatient Hospital Stay (HOSPITAL_COMMUNITY)
Admission: AD | Admit: 2014-11-26 | Discharge: 2014-11-26 | Disposition: A | Discharge status: Home / Self Care | Payer: Medicaid Other | Source: Ambulatory Visit | Attending: Family Medicine | Admitting: Family Medicine

## 2014-11-26 ENCOUNTER — Encounter (HOSPITAL_COMMUNITY): Payer: Self-pay | Admitting: *Deleted

## 2014-11-26 DIAGNOSIS — Z3493 Encounter for supervision of normal pregnancy, unspecified, third trimester: Secondary | ICD-10-CM | POA: Diagnosis present

## 2014-11-26 NOTE — OB Triage Note (Signed)
Patient discharged to home with all belongings in good condition with family member. Copy of discharge instructions given and patient verbalized understanding. Sharon Stone, [place]RN 11/26/2014 5:49 PM

## 2014-11-26 NOTE — Discharge Instructions (Signed)
Braxton Hicks Contractions °Contractions of the uterus can occur throughout pregnancy. Contractions are not always a sign that you are in labor.  °WHAT ARE BRAXTON HICKS CONTRACTIONS?  °Contractions that occur before labor are called Braxton Hicks contractions, or false labor. Toward the end of pregnancy (32-34 weeks), these contractions can develop more often and may become more forceful. This is not true labor because these contractions do not result in opening (dilatation) and thinning of the cervix. They are sometimes difficult to tell apart from true labor because these contractions can be forceful and people have different pain tolerances. You should not feel embarrassed if you go to the hospital with false labor. Sometimes, the only way to tell if you are in true labor is for your health care provider to look for changes in the cervix. °If there are no prenatal problems or other health problems associated with the pregnancy, it is completely safe to be sent home with false labor and await the onset of true labor. °HOW CAN YOU TELL THE DIFFERENCE BETWEEN TRUE AND FALSE LABOR? °False Labor °· The contractions of false labor are usually shorter and not as hard as those of true labor.   °· The contractions are usually irregular.   °· The contractions are often felt in the front of the lower abdomen and in the groin.   °· The contractions may go away when you walk around or change positions while lying down.   °· The contractions get weaker and are shorter lasting as time goes on.   °· The contractions do not usually become progressively stronger, regular, and closer together as with true labor.   °True Labor °· Contractions in true labor last 30-70 seconds, become very regular, usually become more intense, and increase in frequency.   °· The contractions do not go away with walking.   °· The discomfort is usually felt in the top of the uterus and spreads to the lower abdomen and low back.   °· True labor can be  determined by your health care provider with an exam. This will show that the cervix is dilating and getting thinner.   °WHAT TO REMEMBER °· Keep up with your usual exercises and follow other instructions given by your health care provider.   °· Take medicines as directed by your health care provider.   °· Keep your regular prenatal appointments.   °· Eat and drink lightly if you think you are going into labor.   °· If Braxton Hicks contractions are making you uncomfortable:   °¨ Change your position from lying down or resting to walking, or from walking to resting.   °¨ Sit and rest in a tub of warm water.   °¨ Drink 2-3 glasses of water. Dehydration may cause these contractions.   °¨ Do slow and deep breathing several times an hour.   °WHEN SHOULD I SEEK IMMEDIATE MEDICAL CARE? °Seek immediate medical care if: °· Your contractions become stronger, more regular, and closer together.   °· You have fluid leaking or gushing from your vagina.   °· You have a fever.   °· You pass blood-tinged mucus.   °· You have vaginal bleeding.   °· You have continuous abdominal pain.   °· You have low back pain that you never had before.   °· You feel your baby's head pushing down and causing pelvic pressure.   °· Your baby is not moving as much as it used to.   °  °This information is not intended to replace advice given to you by your health care provider. Make sure you discuss any questions you have with your health care   provider. °  °Document Released: 01/13/2005 Document Revised: 01/18/2013 Document Reviewed: 10/25/2012 °Elsevier Interactive Patient Education ©2016 Elsevier Inc. ° °

## 2014-11-28 ENCOUNTER — Telehealth (HOSPITAL_COMMUNITY): Payer: Self-pay | Admitting: *Deleted

## 2014-11-28 NOTE — Telephone Encounter (Signed)
Preadmission screen  

## 2014-12-02 ENCOUNTER — Encounter (HOSPITAL_COMMUNITY)
Admission: RE | Disposition: A | Discharge status: Home / Self Care | Payer: Self-pay | Source: Ambulatory Visit | Attending: Obstetrics and Gynecology

## 2014-12-02 ENCOUNTER — Inpatient Hospital Stay (HOSPITAL_COMMUNITY)
Admission: RE | Admit: 2014-12-02 | Discharge: 2014-12-04 | DRG: 765 | Disposition: A | Discharge status: Home / Self Care | Payer: BLUE CROSS/BLUE SHIELD | Source: Ambulatory Visit | Attending: Obstetrics and Gynecology | Admitting: Obstetrics and Gynecology

## 2014-12-02 ENCOUNTER — Encounter (HOSPITAL_COMMUNITY): Payer: Self-pay

## 2014-12-02 ENCOUNTER — Inpatient Hospital Stay (HOSPITAL_COMMUNITY): Payer: BLUE CROSS/BLUE SHIELD | Admitting: Anesthesiology

## 2014-12-02 DIAGNOSIS — J45909 Unspecified asthma, uncomplicated: Secondary | ICD-10-CM | POA: Diagnosis present

## 2014-12-02 DIAGNOSIS — O480 Post-term pregnancy: Secondary | ICD-10-CM | POA: Diagnosis present

## 2014-12-02 DIAGNOSIS — Z3A40 40 weeks gestation of pregnancy: Secondary | ICD-10-CM

## 2014-12-02 DIAGNOSIS — O9902 Anemia complicating childbirth: Secondary | ICD-10-CM | POA: Diagnosis present

## 2014-12-02 DIAGNOSIS — O99214 Obesity complicating childbirth: Secondary | ICD-10-CM | POA: Diagnosis present

## 2014-12-02 DIAGNOSIS — Z6841 Body Mass Index (BMI) 40.0 and over, adult: Secondary | ICD-10-CM

## 2014-12-02 DIAGNOSIS — O9952 Diseases of the respiratory system complicating childbirth: Secondary | ICD-10-CM | POA: Diagnosis present

## 2014-12-02 DIAGNOSIS — O9962 Diseases of the digestive system complicating childbirth: Secondary | ICD-10-CM | POA: Diagnosis present

## 2014-12-02 DIAGNOSIS — K219 Gastro-esophageal reflux disease without esophagitis: Secondary | ICD-10-CM | POA: Diagnosis present

## 2014-12-02 DIAGNOSIS — F1220 Cannabis dependence, uncomplicated: Secondary | ICD-10-CM | POA: Diagnosis present

## 2014-12-02 DIAGNOSIS — O99324 Drug use complicating childbirth: Secondary | ICD-10-CM | POA: Diagnosis present

## 2014-12-02 DIAGNOSIS — Z3A41 41 weeks gestation of pregnancy: Secondary | ICD-10-CM

## 2014-12-02 DIAGNOSIS — F17210 Nicotine dependence, cigarettes, uncomplicated: Secondary | ICD-10-CM | POA: Diagnosis present

## 2014-12-02 DIAGNOSIS — O99334 Smoking (tobacco) complicating childbirth: Secondary | ICD-10-CM | POA: Diagnosis present

## 2014-12-02 LAB — RPR: RPR: NONREACTIVE

## 2014-12-02 LAB — CBC
HCT: 29.6 % — ABNORMAL LOW (ref 36.0–46.0)
HEMOGLOBIN: 9.8 g/dL — AB (ref 12.0–15.0)
MCH: 25.7 pg — AB (ref 26.0–34.0)
MCHC: 33.1 g/dL (ref 30.0–36.0)
MCV: 77.7 fL — AB (ref 78.0–100.0)
PLATELETS: 164 10*3/uL (ref 150–400)
RBC: 3.81 MIL/uL — AB (ref 3.87–5.11)
RDW: 16.5 % — ABNORMAL HIGH (ref 11.5–15.5)
WBC: 11.6 10*3/uL — AB (ref 4.0–10.5)

## 2014-12-02 LAB — HIV ANTIBODY (ROUTINE TESTING W REFLEX): HIV SCREEN 4TH GENERATION: NONREACTIVE

## 2014-12-02 LAB — PREPARE RBC (CROSSMATCH)

## 2014-12-02 SURGERY — Surgical Case
Anesthesia: Epidural

## 2014-12-02 MED ORDER — LACTATED RINGERS IV SOLN
500.0000 mL | INTRAVENOUS | Status: DC | PRN
  Administered 2014-12-02 (×2): 500 mL via INTRAVENOUS

## 2014-12-02 MED ORDER — SENNOSIDES-DOCUSATE SODIUM 8.6-50 MG PO TABS
2.0000 | ORAL_TABLET | ORAL | Status: DC
  Administered 2014-12-03 (×2): 2 via ORAL
  Filled 2014-12-02 (×2): qty 2

## 2014-12-02 MED ORDER — MENTHOL 3 MG MT LOZG: 1.0000 | LOZENGE | OROMUCOSAL | Status: DC | PRN

## 2014-12-02 MED ORDER — ONDANSETRON HCL 4 MG/2ML IJ SOLN
4.0000 mg | Freq: Four times a day (QID) | INTRAMUSCULAR | Status: DC | PRN
  Administered 2014-12-02: 4 mg via INTRAVENOUS

## 2014-12-02 MED ORDER — DIPHENHYDRAMINE HCL 50 MG/ML IJ SOLN: 12.5000 mg | INTRAMUSCULAR | Status: DC | PRN

## 2014-12-02 MED ORDER — DIPHENHYDRAMINE HCL 25 MG PO CAPS
25.0000 mg | ORAL_CAPSULE | ORAL | Status: DC | PRN
  Filled 2014-12-02: qty 1

## 2014-12-02 MED ORDER — TERBUTALINE SULFATE 1 MG/ML IJ SOLN
0.2500 mg | Freq: Once | INTRAMUSCULAR | Status: AC | PRN
  Administered 2014-12-02: 0.25 mg via SUBCUTANEOUS
  Filled 2014-12-02: qty 1

## 2014-12-02 MED ORDER — IBUPROFEN 600 MG PO TABS: 600.0000 mg | ORAL_TABLET | Freq: Four times a day (QID) | ORAL | Status: DC | PRN

## 2014-12-02 MED ORDER — PRENATAL MULTIVITAMIN CH
1.0000 | ORAL_TABLET | Freq: Every day | ORAL | Status: DC
  Administered 2014-12-03 – 2014-12-04 (×2): 1 via ORAL
  Filled 2014-12-02 (×2): qty 1

## 2014-12-02 MED ORDER — CITRIC ACID-SODIUM CITRATE 334-500 MG/5ML PO SOLN
30.0000 mL | ORAL | Status: DC | PRN
  Administered 2014-12-02: 30 mL via ORAL
  Filled 2014-12-02: qty 15

## 2014-12-02 MED ORDER — OXYCODONE-ACETAMINOPHEN 5-325 MG PO TABS
1.0000 | ORAL_TABLET | ORAL | Status: DC | PRN
  Administered 2014-12-03 – 2014-12-04 (×2): 1 via ORAL
  Filled 2014-12-02 (×2): qty 1

## 2014-12-02 MED ORDER — MEPERIDINE HCL 25 MG/ML IJ SOLN: 6.2500 mg | INTRAMUSCULAR | Status: DC | PRN

## 2014-12-02 MED ORDER — NALBUPHINE HCL 10 MG/ML IJ SOLN: 5.0000 mg | INTRAMUSCULAR | Status: DC | PRN

## 2014-12-02 MED ORDER — OXYTOCIN 40 UNITS IN LACTATED RINGERS INFUSION - SIMPLE MED: 62.5000 mL/h | INTRAVENOUS | Status: DC

## 2014-12-02 MED ORDER — PHENYLEPHRINE 40 MCG/ML (10ML) SYRINGE FOR IV PUSH (FOR BLOOD PRESSURE SUPPORT)
80.0000 ug | PREFILLED_SYRINGE | INTRAVENOUS | Status: DC | PRN
  Filled 2014-12-02: qty 20

## 2014-12-02 MED ORDER — FENTANYL CITRATE (PF) 100 MCG/2ML IJ SOLN: 100.0000 ug | INTRAMUSCULAR | Status: DC | PRN

## 2014-12-02 MED ORDER — OXYTOCIN 40 UNITS IN LACTATED RINGERS INFUSION - SIMPLE MED: 62.5000 mL/h | INTRAVENOUS | Status: AC

## 2014-12-02 MED ORDER — SIMETHICONE 80 MG PO CHEW
80.0000 mg | CHEWABLE_TABLET | ORAL | Status: DC
  Administered 2014-12-03 (×2): 80 mg via ORAL
  Filled 2014-12-02 (×2): qty 1

## 2014-12-02 MED ORDER — LACTATED RINGERS IV SOLN
INTRAVENOUS | Status: DC
  Administered 2014-12-02 (×2): via INTRAUTERINE

## 2014-12-02 MED ORDER — SIMETHICONE 80 MG PO CHEW: 80.0000 mg | CHEWABLE_TABLET | ORAL | Status: DC | PRN

## 2014-12-02 MED ORDER — ONDANSETRON HCL 4 MG/2ML IJ SOLN: 4.0000 mg | Freq: Three times a day (TID) | INTRAMUSCULAR | Status: DC | PRN

## 2014-12-02 MED ORDER — OXYCODONE-ACETAMINOPHEN 5-325 MG PO TABS: 1.0000 | ORAL_TABLET | ORAL | Status: DC | PRN

## 2014-12-02 MED ORDER — KETOROLAC TROMETHAMINE 30 MG/ML IJ SOLN: 30.0000 mg | Freq: Four times a day (QID) | INTRAMUSCULAR | Status: DC | PRN

## 2014-12-02 MED ORDER — SIMETHICONE 80 MG PO CHEW
80.0000 mg | CHEWABLE_TABLET | Freq: Three times a day (TID) | ORAL | Status: DC
  Administered 2014-12-03 – 2014-12-04 (×4): 80 mg via ORAL
  Filled 2014-12-02 (×4): qty 1

## 2014-12-02 MED ORDER — SODIUM BICARBONATE 8.4 % IV SOLN
INTRAVENOUS | Status: DC | PRN
  Administered 2014-12-02 (×2): 5 mL via EPIDURAL

## 2014-12-02 MED ORDER — ONDANSETRON HCL 4 MG/2ML IJ SOLN
INTRAMUSCULAR | Status: AC
  Filled 2014-12-02: qty 2

## 2014-12-02 MED ORDER — IBUPROFEN 600 MG PO TABS
600.0000 mg | ORAL_TABLET | Freq: Four times a day (QID) | ORAL | Status: DC
  Administered 2014-12-03 – 2014-12-04 (×7): 600 mg via ORAL
  Filled 2014-12-02 (×7): qty 1

## 2014-12-02 MED ORDER — TERBUTALINE SULFATE 1 MG/ML IJ SOLN
INTRAMUSCULAR | Status: AC
  Filled 2014-12-02: qty 1

## 2014-12-02 MED ORDER — LIDOCAINE HCL (PF) 1 % IJ SOLN
INTRAMUSCULAR | Status: DC | PRN
  Administered 2014-12-02 (×2): 4 mL via EPIDURAL

## 2014-12-02 MED ORDER — LACTATED RINGERS IV SOLN
INTRAVENOUS | Status: DC
  Administered 2014-12-03: via INTRAVENOUS

## 2014-12-02 MED ORDER — EPHEDRINE 5 MG/ML INJ: 10.0000 mg | INTRAVENOUS | Status: DC | PRN

## 2014-12-02 MED ORDER — DIBUCAINE 1 % RE OINT: 1.0000 "application " | TOPICAL_OINTMENT | RECTAL | Status: DC | PRN

## 2014-12-02 MED ORDER — ACETAMINOPHEN 325 MG PO TABS: 650.0000 mg | ORAL_TABLET | ORAL | Status: DC | PRN

## 2014-12-02 MED ORDER — DIPHENHYDRAMINE HCL 25 MG PO CAPS
25.0000 mg | ORAL_CAPSULE | Freq: Four times a day (QID) | ORAL | Status: DC | PRN
  Administered 2014-12-02 – 2014-12-03 (×2): 25 mg via ORAL
  Filled 2014-12-02 (×2): qty 1

## 2014-12-02 MED ORDER — MISOPROSTOL 50MCG HALF TABLET
50.0000 ug | ORAL_TABLET | ORAL | Status: DC
  Administered 2014-12-02: 50 ug via ORAL
  Filled 2014-12-02: qty 0.5

## 2014-12-02 MED ORDER — SODIUM CHLORIDE 0.9 % IJ SOLN
3.0000 mL | INTRAMUSCULAR | Status: DC | PRN
Start: 2014-12-02 — End: 2014-12-02

## 2014-12-02 MED ORDER — ZOLPIDEM TARTRATE 5 MG PO TABS
5.0000 mg | ORAL_TABLET | Freq: Every evening | ORAL | Status: DC | PRN
  Administered 2014-12-02: 5 mg via ORAL
  Filled 2014-12-02: qty 1

## 2014-12-02 MED ORDER — LACTATED RINGERS IV SOLN
40.0000 [IU] | INTRAVENOUS | Status: DC | PRN
  Administered 2014-12-02: 40 [IU] via INTRAVENOUS

## 2014-12-02 MED ORDER — SODIUM CHLORIDE 0.9 % IR SOLN
Status: DC | PRN
  Administered 2014-12-02: 1000 mL

## 2014-12-02 MED ORDER — TETANUS-DIPHTH-ACELL PERTUSSIS 5-2.5-18.5 LF-MCG/0.5 IM SUSP: 0.5000 mL | Freq: Once | INTRAMUSCULAR | Status: DC

## 2014-12-02 MED ORDER — LANOLIN HYDROUS EX OINT: 1.0000 "application " | TOPICAL_OINTMENT | CUTANEOUS | Status: DC | PRN

## 2014-12-02 MED ORDER — NALBUPHINE HCL 10 MG/ML IJ SOLN: 5.0000 mg | Freq: Once | INTRAMUSCULAR | Status: DC | PRN

## 2014-12-02 MED ORDER — ZOLPIDEM TARTRATE 5 MG PO TABS: 5.0000 mg | ORAL_TABLET | Freq: Every evening | ORAL | Status: DC | PRN

## 2014-12-02 MED ORDER — LACTATED RINGERS IV SOLN
INTRAVENOUS | Status: DC
  Administered 2014-12-02 (×5): via INTRAVENOUS

## 2014-12-02 MED ORDER — NALOXONE HCL 2 MG/2ML IJ SOSY
1.0000 ug/kg/h | PREFILLED_SYRINGE | INTRAVENOUS | Status: DC | PRN
  Filled 2014-12-02: qty 2

## 2014-12-02 MED ORDER — OXYTOCIN BOLUS FROM INFUSION: 500.0000 mL | INTRAVENOUS | Status: DC

## 2014-12-02 MED ORDER — MORPHINE SULFATE (PF) 0.5 MG/ML IJ SOLN
INTRAMUSCULAR | Status: DC | PRN
  Administered 2014-12-02: 3 mg via EPIDURAL

## 2014-12-02 MED ORDER — OXYTOCIN 40 UNITS IN LACTATED RINGERS INFUSION - SIMPLE MED
1.0000 m[IU]/min | INTRAVENOUS | Status: DC
  Filled 2014-12-02: qty 1000

## 2014-12-02 MED ORDER — FENTANYL 2.5 MCG/ML BUPIVACAINE 1/10 % EPIDURAL INFUSION (WH - ANES)
14.0000 mL/h | INTRAMUSCULAR | Status: DC | PRN
  Administered 2014-12-02: 14 mL/h via EPIDURAL
  Filled 2014-12-02: qty 125

## 2014-12-02 MED ORDER — MORPHINE SULFATE (PF) 0.5 MG/ML IJ SOLN
INTRAMUSCULAR | Status: AC
  Filled 2014-12-02: qty 100

## 2014-12-02 MED ORDER — OXYTOCIN 10 UNIT/ML IJ SOLN
INTRAMUSCULAR | Status: AC
  Filled 2014-12-02: qty 4

## 2014-12-02 MED ORDER — OXYCODONE-ACETAMINOPHEN 5-325 MG PO TABS: 2.0000 | ORAL_TABLET | ORAL | Status: DC | PRN

## 2014-12-02 MED ORDER — WITCH HAZEL-GLYCERIN EX PADS: 1.0000 "application " | MEDICATED_PAD | CUTANEOUS | Status: DC | PRN

## 2014-12-02 MED ORDER — FENTANYL CITRATE (PF) 100 MCG/2ML IJ SOLN: 25.0000 ug | INTRAMUSCULAR | Status: DC | PRN

## 2014-12-02 MED ORDER — LIDOCAINE HCL (PF) 1 % IJ SOLN
30.0000 mL | INTRAMUSCULAR | Status: DC | PRN
  Filled 2014-12-02: qty 30

## 2014-12-02 MED ORDER — TERBUTALINE SULFATE 1 MG/ML IJ SOLN: 0.2500 mg | Freq: Once | INTRAMUSCULAR | Status: DC

## 2014-12-02 MED ORDER — MISOPROSTOL 25 MCG QUARTER TABLET
25.0000 ug | ORAL_TABLET | ORAL | Status: DC
  Administered 2014-12-02: 25 ug via ORAL
  Filled 2014-12-02: qty 0.25

## 2014-12-02 MED ORDER — NALOXONE HCL 0.4 MG/ML IJ SOLN: 0.4000 mg | INTRAMUSCULAR | Status: DC | PRN

## 2014-12-02 MED ORDER — DEXTROSE 5 % IV SOLN
3.0000 g | INTRAVENOUS | Status: DC | PRN
  Administered 2014-12-02: 3 g via INTRAVENOUS

## 2014-12-02 SURGICAL SUPPLY — 36 items
BENZOIN TINCTURE PRP APPL 2/3 (GAUZE/BANDAGES/DRESSINGS) IMPLANT
CLAMP CORD UMBIL (MISCELLANEOUS) IMPLANT
CLOSURE STERI-STRIP 1/4X4 (GAUZE/BANDAGES/DRESSINGS) ×3 IMPLANT
CLOSURE WOUND 1/2 X4 (GAUZE/BANDAGES/DRESSINGS)
CLOTH BEACON ORANGE TIMEOUT ST (SAFETY) ×3 IMPLANT
DRAPE SHEET LG 3/4 BI-LAMINATE (DRAPES) IMPLANT
DRSG OPSITE POSTOP 4X10 (GAUZE/BANDAGES/DRESSINGS) ×3 IMPLANT
DURAPREP 26ML APPLICATOR (WOUND CARE) ×3 IMPLANT
ELECT REM PT RETURN 9FT ADLT (ELECTROSURGICAL) ×3
ELECTRODE REM PT RTRN 9FT ADLT (ELECTROSURGICAL) ×1 IMPLANT
EXTRACTOR VACUUM KIWI (MISCELLANEOUS) ×3 IMPLANT
GLOVE BIO SURGEON ST LM GN SZ9 (GLOVE) ×3 IMPLANT
GLOVE BIOGEL PI IND STRL 9 (GLOVE) ×1 IMPLANT
GLOVE BIOGEL PI INDICATOR 9 (GLOVE) ×2
GOWN STRL REUS W/TWL 2XL LVL3 (GOWN DISPOSABLE) ×3 IMPLANT
GOWN STRL REUS W/TWL LRG LVL3 (GOWN DISPOSABLE) ×3 IMPLANT
NEEDLE HYPO 25X5/8 SAFETYGLIDE (NEEDLE) IMPLANT
NS IRRIG 1000ML POUR BTL (IV SOLUTION) ×3 IMPLANT
PACK C SECTION WH (CUSTOM PROCEDURE TRAY) ×3 IMPLANT
PAD OB MATERNITY 4.3X12.25 (PERSONAL CARE ITEMS) ×3 IMPLANT
PENCIL SMOKE EVAC W/HOLSTER (ELECTROSURGICAL) ×3 IMPLANT
RTRCTR C-SECT PINK 25CM LRG (MISCELLANEOUS) IMPLANT
RTRCTR C-SECT PINK 34CM XLRG (MISCELLANEOUS) IMPLANT
STRIP CLOSURE SKIN 1/2X4 (GAUZE/BANDAGES/DRESSINGS) IMPLANT
SUT MNCRL 0 VIOLET CTX 36 (SUTURE) ×3 IMPLANT
SUT MONOCRYL 0 CTX 36 (SUTURE) ×6
SUT PLAIN 2 0 (SUTURE) ×2
SUT PLAIN ABS 2-0 CT1 27XMFL (SUTURE) ×1 IMPLANT
SUT VIC AB 0 CT1 27 (SUTURE) ×2
SUT VIC AB 0 CT1 27XBRD ANBCTR (SUTURE) ×1 IMPLANT
SUT VIC AB 2-0 CT1 27 (SUTURE) ×2
SUT VIC AB 2-0 CT1 TAPERPNT 27 (SUTURE) ×1 IMPLANT
SUT VIC AB 4-0 KS 27 (SUTURE) ×3 IMPLANT
SYR BULB IRRIGATION 50ML (SYRINGE) IMPLANT
TOWEL OR 17X24 6PK STRL BLUE (TOWEL DISPOSABLE) ×3 IMPLANT
TRAY FOLEY CATH SILVER 14FR (SET/KITS/TRAYS/PACK) ×3 IMPLANT

## 2014-12-02 NOTE — Progress Notes (Signed)
Anesthesia updated on pt status.  May go to floor.

## 2014-12-02 NOTE — Anesthesia Procedure Notes (Signed)
Epidural Patient location during procedure: OB Start time: 12/02/2014 11:14 AM  Staffing Anesthesiologist: Mal AmabileFOSTER, Carry Ortez Performed by: anesthesiologist   Preanesthetic Checklist Completed: patient identified, site marked, surgical consent, pre-op evaluation, timeout performed, IV checked, risks and benefits discussed and monitors and equipment checked  Epidural Patient position: sitting Prep: site prepped and draped and DuraPrep Patient monitoring: continuous pulse ox and blood pressure Approach: midline Location: L3-L4 Injection technique: LOR air  Needle:  Needle type: Tuohy  Needle gauge: 17 G Needle length: 9 cm and 9 Needle insertion depth: 8 cm Catheter type: closed end flexible Catheter size: 19 Gauge Catheter at skin depth: 14 cm Test dose: negative and Other  Assessment Events: blood not aspirated, injection not painful, no injection resistance, negative IV test and no paresthesia  Additional Notes Patient identified. Risks and benefits discussed including failed block, incomplete  Pain control, post dural puncture headache, nerve damage, paralysis, blood pressure Changes, nausea, vomiting, reactions to medications-both toxic and allergic and post Partum back pain. All questions were answered. Patient expressed understanding and wished to proceed. Sterile technique was used throughout procedure. Epidural site was Dressed with sterile barrier dressing. No paresthesias, signs of intravascular injection Or signs of intrathecal spread were encountered.  Patient was more comfortable after the epidural was dosed. Please see RN's note for documentation of vital signs and FHR which are stable.

## 2014-12-02 NOTE — Progress Notes (Signed)
Sharon Stone is a 24 y.o. G2P1001 at 3572w0d  admitted for induction of labor due to Post dates. Due date 10/29.  Subjective: Pt having repetitive decels , late in position, v-shaped in character. Pt had meconium at time of ROM. IUPC in place, and amnioinfusion of initial bolus has been completed with minimal improvement. Terbutalene x 1 has been used, o2 in place.  Objective: BP 105/60 mmHg  Pulse 100  Temp(Src) 98.2 F (36.8 C) (Oral)  Resp 20  Ht 5\' 5"  (1.651 m)  Wt 127.461 kg (281 lb)  BMI 46.76 kg/m2  SpO2 100%  LMP 02/18/2014      FHT:  FHR: 145 bpm, variability: moderate,  accelerations:  Present,  decelerations:  Present repetitive , each contraction late in position, v-shaped in character UC:   regular, every 2 minutes in response to cytotec. SVE:   Dilation: 5 Effacement (%): 80 Station: -2 Exam by:: Marcelino DusterMichelle, RN  Exam confirmed by me, cervix tissue very firm, s/p foley bulb, not anticipating ready progress. Labs: Lab Results  Component Value Date   WBC 11.6* 12/02/2014   HGB 9.8* 12/02/2014   HCT 29.6* 12/02/2014   MCV 77.7* 12/02/2014   PLT 164 12/02/2014    Assessment / Plan: non-reassuring fetal status, inadequate response to terbutalene, and  amnioinfusion.  Labor: will stop Preeclampsia:   Fetal Wellbeing:  Category II Pain Control:  Epidural I/D:  n/a Anticipated MOD:  cesarean section recommended, explained to patient and partner, to OR for urgent cesarean section.  Lori Popowski V 12/02/2014, 2:44 PM

## 2014-12-02 NOTE — Transfer of Care (Signed)
Immediate Anesthesia Transfer of Care Note  Patient: Sharon Stone  Procedure(s) Performed: Procedure(s): CESAREAN SECTION (N/A)  Patient Location: PACU  Anesthesia Type:Epidural  Level of Consciousness: awake and alert   Airway & Oxygen Therapy: Patient Spontanous Breathing  Post-op Assessment: Report given to RN and Post -op Vital signs reviewed and stable  Post vital signs: Reviewed  Last Vitals:  Filed Vitals:   12/02/14 1500  BP: 131/69  Pulse: 101  Temp:   Resp:     Complications: No apparent anesthesia complications

## 2014-12-02 NOTE — Progress Notes (Signed)
Colon Sharon Stone is a 24 y.o. G2P1001 at 5959w0d   Subjective: SROM@0330  for initially clear but now with thick meconium. Difficulty tracing ctx and fhr  Objective: BP 125/68 mmHg  Pulse 87  Temp(Src) 97.7 F (36.5 C) (Oral)  Resp 20  Ht 5\' 5"  (1.651 m)  Wt 281 lb (127.461 kg)  BMI 46.76 kg/m2  SpO2 100%  LMP 02/18/2014      FHT:  FHR: 130s bpm, variability: moderate,  accelerations:  Present,  decelerations:  Present Variables UC:   q1-2 minutes SVE:   Dilation: 5 Effacement (%): 80 Station: -2 Exam by:: Sharon DusterMichelle, RN   AROM- forebag, thick mec IUPC placed- easily passed with bloody/mec fluid return FSE placed  Labs: Lab Results  Component Value Date   WBC 11.6* 12/02/2014   HGB 9.8* 12/02/2014   HCT 29.6* 12/02/2014   MCV 77.7* 12/02/2014   PLT 164 12/02/2014    Assessment / Plan: IUP@41 .0wks #Labor: has not needed pitocin. SROM, clear with now meconium. Monitor MVU #FWB Cat II #GBS neg   Sharon FlakeKimberly Stone Sharon Stone 12/02/2014, 2:16 PM

## 2014-12-02 NOTE — Lactation Note (Signed)
This note was copied from the chart of Sharon Stone. Lactation Consultation Note  Patient Name: Sharon Stone ZOXWR'UToday's Date: 12/02/2014 Reason for consult: Initial assessment Baby at 7 hr of life and mom reports bf is going well. No questions or concerns at this time. She stated that bf was good with her 353 yr old daughter until she went back to work and got overwhelmed with pumping and feeding. 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 She did get a free pump from her health insurance and knows how to use it. Discussed baby behavior, milk transition, breast changes, nipple care, and manual expression. Given lactation handouts and information on marijuana while bf. She is aware of OP lactation and support group.   Maternal Data Has patient been taught Hand Expression?: Yes Does the patient have breastfeeding experience prior to this delivery?: Yes  Feeding Feeding Type: Breast Fed Length of feed: 10 min  LATCH Score/Interventions Latch: Repeated attempts needed to sustain latch, nipple held in mouth throughout feeding, stimulation needed to elicit sucking reflex. Intervention(s): Adjust position;Assist with latch  Audible Swallowing: Spontaneous and intermittent Intervention(s): Skin to skin Intervention(s): Hand expression  Type of Nipple: Everted at rest and after stimulation  Comfort (Breast/Nipple): Soft / non-tender     Hold (Positioning): No assistance needed to correctly position infant at breast. Intervention(s): Breastfeeding basics reviewed;Support Pillows;Skin to skin  LATCH Score: 9  Lactation Tools Discussed/Used WIC Program: Yes   Consult Status Consult Status: Follow-up Date: 12/03/14 Follow-up type: In-patient    Sharon Stone 12/02/2014, 11:11 PM

## 2014-12-02 NOTE — H&P (Signed)
Sharon Stone is a 24 y.o. female G2P1001 @ 41.0wks by LMP and confirmed by U/S presenting for IOL due to post term. Denies ctx, leaking or bldg. Reports +FM. Her preg has been followed by the [facility] since the first trimester and has been remarkable for 1) obesity 2) prev smoker early in preg 3) THC use in early preg 4) marginal cord insertion 5) GBS neg  History OB History    Gravida Para Term Preterm AB TAB SAB Ectopic Multiple Living   2 1 1       1      Past Medical History  Diagnosis Date  . Asthma   . Pregnant    Past Surgical History  Procedure Laterality Date  . No past surgeries     Family History: family history includes Cancer in her maternal grandmother and mother; Diabetes in her maternal aunt and maternal grandmother; Heart disease in her maternal grandmother. Social History:  reports that she has been smoking Cigarettes.  She does not have any smokeless tobacco history on file. She reports that she uses illicit drugs (Marijuana). She reports that she does not drink alcohol.   Prenatal Transfer Tool  Maternal Diabetes: No Genetic Screening: Normal Maternal Ultrasounds/Referrals: Normal- marginal cord insertion Fetal Ultrasounds or other Referrals:  None Maternal Substance Abuse:  Yes:  Type: Marijuana Significant Maternal Medications:  None Significant Maternal Lab Results:  Lab values include: Group B Strep negative Other Comments:  None  ROS    Vitals: 98, 134/72, 98, 18 Last menstrual period 02/18/2014. Exam Physical Exam  Constitutional: She is oriented to person, place, and time. She appears well-developed.  HENT:  Head: Normocephalic.  Neck: Normal range of motion.  Cardiovascular: Normal rate.   Respiratory: Effort normal.  GI:  EFM 130s, +accels, no decels Irreg, mild ctx Obese, gravid abd  Genitourinary: Vagina normal.  Cx 1/50/-3  Musculoskeletal: Normal range of motion.  Neurological: She is alert and oriented to person, place, and time.   Skin: Skin is warm and dry.  Psychiatric: She has a normal mood and affect. Her behavior is normal. Thought content normal.    Prenatal labs: ABO, Rh: O/Positive/-- (03/28 0000) Antibody: Negative (03/28 0000) Rubella: Immune (03/28 0000) RPR: Nonreactive (03/28 0000)  HBsAg: Negative (03/28 0000)  HIV: Non-reactive (03/28 0000)  GBS: Negative (10/03 0000)   Assessment/Plan: IUP@ 41.0wks Cx unfavorable  [facility] Plan cx ripening w/ cytotec during the night, followed by foley or Pit Anticipate SVD   Delonda Coley CNM 12/02/2014, 12:14 AM

## 2014-12-02 NOTE — Anesthesia Preprocedure Evaluation (Addendum)
Anesthesia Evaluation  Patient identified by MRN, date of birth, ID band Patient awake    Reviewed: Allergy & Precautions, Patient's Chart, lab work & pertinent test results  Airway Mallampati: III  TM Distance: >3 FB Neck ROM: Full    Dental no notable dental hx. (+) Teeth Intact   Pulmonary asthma , Current Smoker,    Pulmonary exam normal breath sounds clear to auscultation       Cardiovascular negative cardio ROS Normal cardiovascular exam Rhythm:Regular Rate:Normal     Neuro/Psych negative neurological ROS  negative psych ROS   GI/Hepatic Neg liver ROS, GERD  Medicated and Controlled,  Endo/Other  Morbid obesity  Renal/GU negative Renal ROS  negative genitourinary   Musculoskeletal negative musculoskeletal ROS (+)   Abdominal (+) + obese,   Peds  Hematology  (+) anemia ,   Anesthesia Other Findings   Reproductive/Obstetrics (+) Pregnancy                            Anesthesia Physical Anesthesia Plan  ASA: III and emergent  Anesthesia Plan: Epidural   Post-op Pain Management:    Induction:   Airway Management Planned: Natural Airway  Additional Equipment:   Intra-op Plan:   Post-operative Plan:   Informed Consent: I have reviewed the patients History and Physical, chart, labs and discussed the procedure including the risks, benefits and alternatives for the proposed anesthesia with the patient or authorized representative who has indicated his/her understanding and acceptance.     Plan Discussed with: Anesthesiologist, CRNA and Surgeon  Anesthesia Plan Comments: (Patient for C/Section for non reassuring FHR tracing. Will use epidural for C/Section. )       Anesthesia Quick Evaluation

## 2014-12-02 NOTE — Brief Op Note (Signed)
12/02/2014  5:43 PM  PATIENT:  Sharon Stone  24 y.o. female  PRE-OPERATIVE DIAGNOSIS:  Fetal intolerance to labor, cesarean section  POST-OPERATIVE DIAGNOSIS:  fetal intolerance to labor, cesarean section  PROCEDURE:  Procedure(s): CESAREAN SECTION (N/A)  SURGEON:  Surgeon(s) and Role:    * Tilda BurrowJohn Ferguson V, MD - Primary    * Federico FlakeKimberly Niles Amar Sippel, MD - Resident - Assisting  PHYSICIAN ASSISTANT:   ASSISTANTS: none   ANESTHESIA:   epidural  EBL:  Total I/O In: 2370 [P.O.:120; I.V.:2250] Out: 975 [Urine:475; Blood:500]

## 2014-12-02 NOTE — Anesthesia Postprocedure Evaluation (Signed)
  Anesthesia Post-op Note  Patient: Sharon Stone  Procedure(s) Performed: Procedure(s): CESAREAN SECTION (N/A)  Patient Location: PACU  Anesthesia Type:Epidural  Level of Consciousness: awake, alert  and oriented  Airway and Oxygen Therapy: Patient Spontanous Breathing  Post-op Pain: none  Post-op Assessment: Post-op Vital signs reviewed, Patient's Cardiovascular Status Stable, Respiratory Function Stable, Patent Airway, No signs of Nausea or vomiting, Pain level controlled, No headache, No backache, Spinal receding and Patient able to bend at knees              Post-op Vital Signs: Reviewed and stable  Last Vitals:  Filed Vitals:   12/02/14 1700  BP: 121/86  Pulse: 76  Temp:   Resp: 17    Complications: No apparent anesthesia complications

## 2014-12-02 NOTE — Progress Notes (Signed)
Sharon Stone is a 24 y.o. G2P1001 at 2142w0d   Subjective: Feeling some mild cramping; rec'd cytotec x 2 (one vag/one PO) during the night; SROM@0330  for initially clear but now MSF  Objective: BP 117/52 mmHg  Pulse 116  Temp(Src) 98.4 F (36.9 C) (Oral)  Resp 18  Ht 5\' 5"  (1.651 m)  Wt 127.461 kg (281 lb)  BMI 46.76 kg/m2  SpO2 100%  LMP 02/18/2014      FHT:  FHR: 130s bpm, variability: moderate,  accelerations:  Present,  decelerations:  Absent UC:   irreg SVE:   Dilation: 1.5 Effacement (%): 50 Station: -2 Exam by:: Sharon Stone, Stone - foley placed without difficulty  Labs: Lab Results  Component Value Date   WBC 11.6* 12/02/2014   HGB 9.8* 12/02/2014   HCT 29.6* 12/02/2014   MCV 77.7* 12/02/2014   PLT 164 12/02/2014    Assessment / Plan: IUP@41 .0wks Cx unfavorable SROM  Leave foley in until it spontaneously comes out  Sharon Stone 12/02/2014, 8:04 AM

## 2014-12-02 NOTE — Op Note (Signed)
12/02/2014  5:43 PM  PATIENT:  Sharon Stone  24 y.o. female  PRE-OPERATIVE DIAGNOSIS:  Fetal intolerance to labor, cesarean section  POST-OPERATIVE DIAGNOSIS:  fetal intolerance to labor, cesarean section  PROCEDURE:  Procedure(s): CESAREAN SECTION (N/A)  SURGEON:  Surgeon(s) and Role:    * Tilda BurrowJohn Keela Rubert V, MD - Primary    * Federico FlakeKimberly Niles Newton, MD - Resident - Assisting  PHYSICIAN ASSISTANT:   ASSISTANTS: none   ANESTHESIA:   epidural  EBL:  Total I/O In: 2370 [P.O.:120; I.V.:2250] Out: 975 [Urine:475; Blood:500]   INDICATIONS: Sharon Stone is a 24 y.o. G2P2002 at 6417w0d here for cesarean section secondary to  Fetal intolerance of labor; please see preoperative note for further details.  The risks of cesarean section were discussed with the patient including but were not limited to: bleeding which may require transfusion or reoperation; infection which may require antibiotics; injury to bowel, bladder, ureters or other surrounding organs; injury to the fetus; need for additional procedures including hysterectomy in the event of a life-threatening hemorrhage; placental abnormalities wth subsequent pregnancies, incisional problems, thromboembolic phenomenon and other postoperative/anesthesia complications.   The patient concurred with the proposed plan, giving informed written consent for the procedure by Dr. Emelda FearFerguson.    FINDINGS:  Viable female infant in cephalic presentation.  Apgars pending.  Thick meconium-stained amniotic fluid, without malodor.  Intact meconium stained placenta, three vessel cord.  Normal uterus, fallopian tubes and ovaries bilaterally.  PROCEDURE IN DETAIL:  The patient preoperatively received intravenous antibiotics and had sequential compression devices applied to her lower extremities.  She was then taken to the operating room where spinal anesthesia was administered the epidural anesthesia was dosed up to surgical level and was found to be  adequate. She was then placed in a dorsal supine position with a leftward tilt, and prepped and draped in a sterile manner.  A foley catheter was already in her bladder and attached to constant gravity.  After an adequate timeout was performed, a Pfannenstiel skin incision was made with scalpel and carried through to the underlying layer of fascia. The fascia was incised in the midline, and this incision was extended bilaterally using the Mayo scissors.  Kocher clamps were applied to the superior aspect of the fascial incision and the underlying rectus muscles were dissected off bluntly. A similar process was carried out on the inferior aspect of the fascial incision. The rectus muscles were separated in the midline bluntly and the peritoneum was entered bluntly. Attention was turned to the lower uterine segment where a low transverse hysterotomy was made with a scalpel and extended bilaterally bluntly.  The Kiwi was used to help deliver the fetal head and the infant was successfully delivered, the cord was clamped and cut and the infant was handed over to awaiting neonatology team. Uterine massage was then administered, and the placenta delivered intact with a three-vessel cord. The uterus was then cleared of clot and debris. One figure of eight was placed on the right lateral edge of the hysterotomy. The hysterotomy was closed with 0 Monocryl in a running locked fashion, and an imbricating layer was also placed with 0 Monocryl.  The pelvis was cleared of all clot and debris. Hemostasis was confirmed on all surfaces.  The peritoneum and the muscles were reapproximated using 0 Vicryl interrupted stitches. The fascia was then closed using 0 PDS in a running fashion.  The subcutaneous layer was irrigated, then reapproximated with 2-0 plain gut interrupted stitches, and 30 ml of 0.5%  Marcaine was injected subcutaneously around the incision.  The skin was closed with a 4-0 Vicryl subcuticular stitch. The patient  tolerated the procedure well. Sponge, lap, instrument and needle counts were correct x 2.  She was taken to the recovery room in stable condition.      Alvester Morin M.D. Family Medicine, OB Fellow [facility] Reviewed and confirmed by Tilda Burrow, MD

## 2014-12-03 LAB — CBC
HCT: 25.7 % — ABNORMAL LOW (ref 36.0–46.0)
Hemoglobin: 8.4 g/dL — ABNORMAL LOW (ref 12.0–15.0)
MCH: 25.9 pg — AB (ref 26.0–34.0)
MCHC: 32.7 g/dL (ref 30.0–36.0)
MCV: 79.3 fL (ref 78.0–100.0)
PLATELETS: 145 10*3/uL — AB (ref 150–400)
RBC: 3.24 MIL/uL — AB (ref 3.87–5.11)
RDW: 16.4 % — AB (ref 11.5–15.5)
WBC: 13 10*3/uL — AB (ref 4.0–10.5)

## 2014-12-03 NOTE — Anesthesia Postprocedure Evaluation (Signed)
  Anesthesia Post-op Note  Patient: Sharon Stone  Procedure(s) Performed: Procedure(s): CESAREAN SECTION (N/A)  Patient Location: Mother/Baby  Anesthesia Type:Epidural  Level of Consciousness: awake, alert , oriented and patient cooperative  Airway and Oxygen Therapy: Patient Spontanous Breathing  Post-op Pain: none  Post-op Assessment: Post-op Vital signs reviewed, Patient's Cardiovascular Status Stable, Respiratory Function Stable, Patent Airway, No headache, No backache and Patient able to bend at knees              Post-op Vital Signs: Reviewed and stable  Last Vitals:  Filed Vitals:   12/03/14 0415  BP: 121/69  Pulse: 77  Temp: 36.8 C  Resp: 16    Complications: No apparent anesthesia complications

## 2014-12-03 NOTE — Progress Notes (Signed)
CLINICAL SOCIAL WORK MATERNAL/CHILD NOTE  Patient Details  Name: Sharon Stone MRN: [id number] Date of Birth: 12/02/2012  Date: 12/03/2014  Clinical Social Worker Initiating Note: Darryl Willner, LCSWDate/ Time Initiated: 12/03/14/1000   Child's Name: Sharon Stone   Legal Guardian:  (Parents Sharon Stone and Sharon Stone)   Need for Interpreter: None   Date of Referral: 12/02/14   Reason for Referral: Other (Comment)   Referral Source: [facility]   Address: [address] [place], [place] [address]  Phone number:  [phone])   Household Members: Minor Children, Significant Other   Natural Supports (not living in the home): Extended Family, Immediate Family   Professional Supports:Other (Comment) ([place])   Employment: (FOB is working two jobs)   Type of Work:     Education:     Museum/gallery curator Resources:Medicaid, Multimedia programmer   Other Resources: [employer], Physicist, medical    Cultural/Religious Considerations Which May Impact Care: none noted  Strengths: Ability to meet basic needs , Home prepared for child    Risk Factors/Current Problems:  (Hx of substance abuse, and unstable housing)   Cognitive State: Alert , Able to Concentrate    Mood/Affect: Happy    CSW Assessment:none noted Acknowledged order for social work consult to assess mother's hx of marijuana use. Met with mother who was pleasant and receptive to CSW. FOB was also present and engaging. Parents are not married. They reside together at [facility] [address] and mother has one other dependent age 74 residing with them. FOB states that he recently started a second job and is working on a plan to secure permanent housing. 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 Informed that she is now down to using about two times a week, and for the past 2 months have been participating in rehab classes. She denies any other  illicit drug use. UDS on newborn was positive for marijuana. Mother denies any hx of mental illness. Informed that she is well prepared at home for newborn. Mother informed of referral that will be made to DSS. She denies any hx of DSS involvement for substance abuse. Also, informed her of CSW availability  CSW Plan/Description:    Case referred to DSS. Spoke with Kennon Portela and informed that newborn may return home with mother and DSS will follow up with the family within 42 hours.  No barriers to discharge   Donney Caraveo J, LCSW 12/03/2014, 3:26 PM

## 2014-12-03 NOTE — Addendum Note (Signed)
Addendum  created 12/03/14 0754 by Yolonda KidaAlison L Mayfield Schoene, CRNA   Modules edited: Notes Section   Notes Section:  File: [id number]

## 2014-12-03 NOTE — Progress Notes (Signed)
Subjective: Postpartum Day 1: Cesarean Delivery Patient reports incisional pain, tolerating PO, + flatus and no problems voiding.    Objective: Vital signs in last 24 hours: Temp:  [97.4 F (36.3 C)-98.6 F (37 C)] 98 F (36.7 C) (11/06 0800) Pulse Rate:  [75-107] 80 (11/06 0800) Resp:  [15-20] 18 (11/06 0800) BP: (98-142)/(54-109) 120/82 mmHg (11/06 0800) SpO2:  [96 %-100 %] 99 % (11/06 0800)  Physical Exam:  General: alert, cooperative, appears stated age and no distress Lochia: appropriate Uterine Fundus: firm Incision: healing well, no significant drainage, no dehiscence, no significant erythema DVT Evaluation: Negative Homan's sign. No cords or calf tenderness.   Recent Labs  12/02/14 0120 12/03/14 0515  HGB 9.8* 8.4*  HCT 29.6* 25.7*    Assessment/Plan: Status post Cesarean section. Doing well postoperatively.  Continue current care.  Wyvonnia DuskyLAWSON, MARIE DARLENE 12/03/2014, 9:19 AM

## 2014-12-04 ENCOUNTER — Encounter (HOSPITAL_COMMUNITY): Payer: Self-pay | Admitting: Obstetrics and Gynecology

## 2014-12-04 MED ORDER — IBUPROFEN 600 MG PO TABS: 600.0000 mg | ORAL_TABLET | Freq: Four times a day (QID) | ORAL | Status: DC

## 2014-12-04 MED ORDER — OXYCODONE-ACETAMINOPHEN 5-325 MG PO TABS: 1.0000 | ORAL_TABLET | ORAL | Status: DC | PRN

## 2014-12-04 NOTE — Lactation Note (Signed)
This note was copied from the chart of Sharon Colon FlatterySantiya Wenker. Lactation Consultation Note  Patient Name: Sharon Stone Sharon Stone: 12/04/2014 Reason for consult: Follow-up assessment Mom reports she is breastfeeding before giving any bottles. Reports baby is latching well and denies the need for assist before d/c home today. Mom reports she has breast pump at home if needed. Advised Mom baby should be at the breast 8-12 times or more in 24 hours, nursing for 15-30 minutes both breasts some feedings. Encouraged to BF before giving supplements to encourage milk production, prevent engorgement and protect milk supply.  Engorgement care reviewed if needed. Advised of OP services and support group.   Maternal Data    Feeding Feeding Type: Breast Fed Nipple Type: Slow - flow Length of feed: 20 min  LATCH Score/Interventions                      Lactation Tools Discussed/Used     Consult Status Consult Status: Complete Stone: 12/04/14 Follow-up type: In-patient    Alfred LevinsGranger, Julianne Chamberlin Ann 12/04/2014, 3:11 PM

## 2014-12-04 NOTE — Discharge Summary (Signed)
       OB Discharge Summary  Patient Name: Colon FlatterySantiya Locatelli DOB: 03/24/1990 MRN: [id number]  Date of admission: 12/02/2014 Delivering MD: Tilda BurrowFERGUSON, JOHN V   Date of discharge: 12/04/2014  Admitting diagnosis: Induction Intrauterine pregnancy: 6381w0d     Secondary diagnosis:Active Problems:   Post term pregnancy, 41 weeks  Additional problems:none     Discharge diagnosis: Term Pregnancy Delivered                                                                     Post partum procedures:none  Augmentation: Cytotec  Complications: None  Hospital course:  Induction of Labor With Cesarean Section  24 y.o. yo G2P2002 at 4581w0d was admitted to the hospital 12/02/2014 for induction of labor. Patient had a labor course significant for fetal intolerance of labor. The patient went for cesarean section due to Non-Reassuring FHR, and delivered a Viable infant,@BABYSUPPRESS (DBLINK,ept,110,,1,,) Membrane Rupture Time/Date: )3:30 AM ,12/02/2014   @Details  of operation can be found in separate operative Note.  Patient had an uncomplicated postpartum course. She is ambulating, tolerating a regular diet, passing flatus, and urinating well.  Patient is discharged home in stable condition on No discharge date for patient encounter.Marland Kitchen.                                     Physical exam  Filed Vitals:   12/03/14 0800 12/03/14 1200 12/03/14 1800 12/04/14 0611  BP: 120/82 128/69 119/56 130/78  Pulse: 80 86 97 83  Temp: 98 F (36.7 C) 97.8 F (36.6 C) 98.1 F (36.7 C) 98.4 F (36.9 C)  TempSrc: Oral Oral Oral Oral  Resp: 18 18 18 20   Height:      Weight:      SpO2: 99% 98%     General: alert, cooperative and no distress Lochia: appropriate Uterine Fundus: firm Incision: Healing well with no significant drainage, No significant erythema, Dressing is clean, dry, and intact DVT Evaluation: Negative Homan's sign. No cords or calf tenderness. Labs: Lab Results  Component Value Date   WBC 13.0*  12/03/2014   HGB 8.4* 12/03/2014   HCT 25.7* 12/03/2014   MCV 79.3 12/03/2014   PLT 145* 12/03/2014   No flowsheet data found.  Discharge instruction: per After Visit Summary and "Baby and Me Booklet".  After Visit Meds:    Medication List    ASK your doctor about these medications        albuterol 108 (90 BASE) MCG/ACT inhaler  Commonly known as:  PROVENTIL HFA;VENTOLIN HFA  Inhale 1-2 puffs into the lungs every 6 (six) hours as needed for wheezing or shortness of breath.     prenatal multivitamin Tabs tablet  Take 1 tablet by mouth daily.        Diet: routine diet  Activity: Advance as tolerated. Pelvic rest for 6 weeks.   Outpatient follow up:6 weeks Follow up Appt:No future appointments. Follow up visit: No Follow-up on file.  Postpartum contraception: Nexplanon  Newborn Data: Live born female  Birth Weight: 7 lb 5.3 oz (3325 g) APGAR: 9, 9  Baby Feeding: Breast Disposition:home with mother   12/04/2014 Ferdie PingLAWSON, Efstathios Sawin DARLENE, CNM

## 2014-12-06 LAB — TYPE AND SCREEN
ABO/RH(D): O POS
Antibody Screen: POSITIVE
DAT, IgG: NEGATIVE
PT AG TYPE: NEGATIVE
UNIT DIVISION: 0
UNIT DIVISION: 0

## 2014-12-08 ENCOUNTER — Telehealth (HOSPITAL_COMMUNITY): Payer: Self-pay | Admitting: *Deleted

## 2015-03-31 ENCOUNTER — Emergency Department (HOSPITAL_COMMUNITY)
Admission: EM | Admit: 2015-03-31 | Discharge: 2015-03-31 | Disposition: A | Discharge status: Home / Self Care | Payer: BLUE CROSS/BLUE SHIELD | Attending: Emergency Medicine | Admitting: Emergency Medicine

## 2015-03-31 ENCOUNTER — Encounter (HOSPITAL_COMMUNITY): Payer: Self-pay | Admitting: Emergency Medicine

## 2015-03-31 DIAGNOSIS — R112 Nausea with vomiting, unspecified: Secondary | ICD-10-CM | POA: Diagnosis present

## 2015-03-31 DIAGNOSIS — J45909 Unspecified asthma, uncomplicated: Secondary | ICD-10-CM | POA: Insufficient documentation

## 2015-03-31 DIAGNOSIS — A084 Viral intestinal infection, unspecified: Secondary | ICD-10-CM | POA: Insufficient documentation

## 2015-03-31 DIAGNOSIS — F17210 Nicotine dependence, cigarettes, uncomplicated: Secondary | ICD-10-CM | POA: Insufficient documentation

## 2015-03-31 DIAGNOSIS — Z79899 Other long term (current) drug therapy: Secondary | ICD-10-CM | POA: Insufficient documentation

## 2015-03-31 DIAGNOSIS — Z3202 Encounter for pregnancy test, result negative: Secondary | ICD-10-CM | POA: Diagnosis not present

## 2015-03-31 DIAGNOSIS — R197 Diarrhea, unspecified: Secondary | ICD-10-CM

## 2015-03-31 DIAGNOSIS — K2970 Gastritis, unspecified, without bleeding: Secondary | ICD-10-CM

## 2015-03-31 LAB — COMPREHENSIVE METABOLIC PANEL
ALK PHOS: 56 U/L (ref 38–126)
ALT: 16 U/L (ref 14–54)
AST: 18 U/L (ref 15–41)
Albumin: 3.6 g/dL (ref 3.5–5.0)
Anion gap: 7 (ref 5–15)
BUN: 9 mg/dL (ref 6–20)
CALCIUM: 8.9 mg/dL (ref 8.9–10.3)
CHLORIDE: 107 mmol/L (ref 101–111)
CO2: 27 mmol/L (ref 22–32)
CREATININE: 0.84 mg/dL (ref 0.44–1.00)
Glucose, Bld: 88 mg/dL (ref 65–99)
Potassium: 3.5 mmol/L (ref 3.5–5.1)
Sodium: 141 mmol/L (ref 135–145)
Total Bilirubin: 0.3 mg/dL (ref 0.3–1.2)
Total Protein: 7.3 g/dL (ref 6.5–8.1)

## 2015-03-31 LAB — URINALYSIS, ROUTINE W REFLEX MICROSCOPIC
GLUCOSE, UA: NEGATIVE mg/dL
HGB URINE DIPSTICK: NEGATIVE
KETONES UR: NEGATIVE mg/dL
LEUKOCYTES UA: NEGATIVE
Nitrite: NEGATIVE
PROTEIN: 30 mg/dL — AB
Specific Gravity, Urine: 1.037 — ABNORMAL HIGH (ref 1.005–1.030)
pH: 6 (ref 5.0–8.0)

## 2015-03-31 LAB — URINE MICROSCOPIC-ADD ON
RBC / HPF: NONE SEEN RBC/hpf (ref 0–5)
WBC, UA: NONE SEEN WBC/hpf (ref 0–5)

## 2015-03-31 LAB — CBC
HCT: 34.5 % — ABNORMAL LOW (ref 36.0–46.0)
Hemoglobin: 10.7 g/dL — ABNORMAL LOW (ref 12.0–15.0)
MCH: 23.6 pg — AB (ref 26.0–34.0)
MCHC: 31 g/dL (ref 30.0–36.0)
MCV: 76.2 fL — AB (ref 78.0–100.0)
PLATELETS: 206 10*3/uL (ref 150–400)
RBC: 4.53 MIL/uL (ref 3.87–5.11)
RDW: 15.7 % — ABNORMAL HIGH (ref 11.5–15.5)
WBC: 4.9 10*3/uL (ref 4.0–10.5)

## 2015-03-31 LAB — I-STAT BETA HCG BLOOD, ED (MC, WL, AP ONLY)

## 2015-03-31 LAB — LIPASE, BLOOD: Lipase: 20 U/L (ref 11–51)

## 2015-03-31 MED ORDER — ONDANSETRON HCL 4 MG/2ML IJ SOLN
4.0000 mg | Freq: Once | INTRAMUSCULAR | Status: AC
  Administered 2015-03-31: 4 mg via INTRAVENOUS
  Filled 2015-03-31: qty 2

## 2015-03-31 MED ORDER — LOPERAMIDE HCL 2 MG PO CAPS: 2.0000 mg | ORAL_CAPSULE | Freq: Four times a day (QID) | ORAL | Status: DC | PRN

## 2015-03-31 MED ORDER — ONDANSETRON 8 MG PO TBDP: ORAL_TABLET | ORAL | Status: DC

## 2015-03-31 MED ORDER — LOPERAMIDE HCL 2 MG PO CAPS
4.0000 mg | ORAL_CAPSULE | ORAL | Status: DC | PRN
  Administered 2015-03-31: 4 mg via ORAL
  Filled 2015-03-31: qty 2

## 2015-03-31 MED ORDER — SODIUM CHLORIDE 0.9 % IV BOLUS (SEPSIS)
2000.0000 mL | Freq: Once | INTRAVENOUS | Status: AC
  Administered 2015-03-31: 2000 mL via INTRAVENOUS

## 2015-03-31 NOTE — ED Notes (Signed)
Reports feeling much better and ready to go home.  Tolerated po challenge well.

## 2015-03-31 NOTE — ED Notes (Signed)
Pt c/o vomiting and diarrhea onset Thursday.

## 2015-03-31 NOTE — Discharge Instructions (Signed)
1. Medications: zofran, imodium, usual home medications 2. Treatment: rest, drink plenty of fluids, advance diet slowly 3. Follow Up: Please followup with your primary doctor in 2 days for discussion of your diagnoses and further evaluation after today's visit; if you do not have a primary care doctor use the resource guide provided to find one; Please return to the ER for persistent vomiting, high fevers or worsening symptoms

## 2015-03-31 NOTE — ED Provider Notes (Signed)
CSN: [id number]     Arrival date & time 03/31/15  1604 History   First MD Initiated Contact with Patient 03/31/15 1720     Chief Complaint  Patient presents with  . Emesis     (Consider location/radiation/quality/duration/timing/severity/associated sxs/prior Treatment) The history is provided by the patient and medical records. No language interpreter was used.     Sharon Stone is a 25 y.o. female  with a hx of asthma presents to the Emergency Department complaining of gradual persistent nausea with intermittent vomiting onset 3 days ago.  No sick contacts, travel, camping, uncooked meat, new foods.  Pt reports associated epigastric abd pain pain when attempting to eat.  She reports associated heartburn and lower abd cramping.  She has had NBNB emesis and watery diarrhea.  Pt reports no melena or hematochezia.  Pt has not taken any medications for the symptoms.  Pt reports hx of c-section in NOv 2016.  No other abd surgeries.  Pt denies Fever, chills, headache, neck pain, chest pain, shortness of breath, weakness Dizziness, syncope, dysuria, hematuria.     Past Medical History  Diagnosis Date  . Asthma   . Pregnant    Past Surgical History  Procedure Laterality Date  . No past surgeries    . Cesarean section N/A 12/02/2014    Procedure: CESAREAN SECTION;  Surgeon: Tilda Burrow, MD;  Location: [facility];  Service: Obstetrics;  Laterality: N/A;   Family History  Problem Relation Age of Onset  . Cancer Mother   . Diabetes Maternal Aunt   . Heart disease Maternal Grandmother   . Diabetes Maternal Grandmother   . Cancer Maternal Grandmother    Social History  Substance Use Topics  . Smoking status: Light Tobacco Smoker    Types: Cigarettes  . Smokeless tobacco: None  . Alcohol Use: No   OB History    Gravida Para Term Preterm AB TAB SAB Ectopic Multiple Living   0 2     Review of Systems  Constitutional: Negative for fever, diaphoresis, appetite change,  fatigue and unexpected weight change.  HENT: Negative for mouth sores.   Eyes: Negative for visual disturbance.  Respiratory: Negative for cough, chest tightness, shortness of breath and wheezing.   Cardiovascular: Negative for chest pain.  Gastrointestinal: Positive for nausea, vomiting, abdominal pain and diarrhea. Negative for constipation.  Endocrine: Negative for polydipsia, polyphagia and polyuria.  Genitourinary: Negative for dysuria, urgency, frequency and hematuria.  Musculoskeletal: Negative for back pain and neck stiffness.  Skin: Negative for rash.  Allergic/Immunologic: Negative for immunocompromised state.  Neurological: Negative for syncope, light-headedness and headaches.  Hematological: Does not bruise/bleed easily.  Psychiatric/Behavioral: Negative for sleep disturbance. The patient is not nervous/anxious.       Allergies  Shellfish allergy  Home Medications   Prior to Admission medications   Medication Sig Start Date End Date Taking? Authorizing Provider  loperamide (IMODIUM) 2 MG capsule Take 1 capsule (2 mg total) by mouth 4 (four) times daily as needed for diarrhea or loose stools. 03/31/15   Avital Dancy, PA-C  ondansetron (ZOFRAN ODT) 8 MG disintegrating tablet  ODT q4 hours prn nausea 03/31/15   Terrea Bruster, PA-C   BP 111/61 mmHg  Pulse 81  Temp(Src) 98.7 F (37.1 C) (Oral)  Resp 18  Ht  (1.626 m)  Wt 122.018 kg  BMI 46.15 kg/m2  SpO2 100%  LMP 03/16/2015  Breastfeeding? No Physical Exam  Constitutional: She appears  well-developed and well-nourished. No distress.  Awake, alert, nontoxic appearance  HENT:  Head: Normocephalic and atraumatic.  Mouth/Throat: Oropharynx is clear and moist. No oropharyngeal exudate.  Eyes: Conjunctivae are normal. No scleral icterus.  Neck: Normal range of motion. Neck supple.  Cardiovascular: Normal rate, regular rhythm, normal heart sounds and intact distal pulses.   No murmur  heard. Pulmonary/Chest: Effort normal and breath sounds normal. No respiratory distress. She has no wheezes.  Equal chest expansion  Abdominal: Soft. Bowel sounds are normal. She exhibits no mass. There is tenderness ( Mild, generalized soreness). There is no rebound and no guarding.  Musculoskeletal: Normal range of motion. She exhibits no edema.  Neurological: She is alert.  Speech is clear and goal oriented Moves extremities without ataxia  Skin: Skin is warm and dry. She is not diaphoretic.  Psychiatric: She has a normal mood and affect.  Nursing note and vitals reviewed.   ED Course  Procedures (including critical care time) Labs Review Labs Reviewed  CBC - Abnormal; Notable for the following:    Hemoglobin 10.7 (*)    HCT 34.5 (*)    MCV 76.2 (*)    MCH 23.6 (*)    RDW 15.7 (*)    All other components within normal limits  URINALYSIS, ROUTINE W REFLEX MICROSCOPIC (NOT AT [facility]) - Abnormal; Notable for the following:    Specific Gravity, Urine 1.037 (*)    Bilirubin Urine SMALL (*)    Protein, ur 30 (*)    All other components within normal limits  URINE MICROSCOPIC-ADD ON - Abnormal; Notable for the following:    Squamous Epithelial / LPF 6-30 (*)    Bacteria, UA RARE (*)    All other components within normal limits  LIPASE, BLOOD  COMPREHENSIVE METABOLIC PANEL  I-STAT BETA HCG BLOOD, ED ([facility], [facility], [facility] ONLY)     MDM   Final diagnoses:  Nausea vomiting and diarrhea  Viral gastritis   Colon FlatterySantiya Wachob presents with nausea, vomiting and diarrhea for the last 3 days.  On exam abdomen is soft and minimally tender. No focal tenderness. No rebound or guarding. Labs are reassuring. Will give fluid bolus, Zofran and Imodium and reassess. Patient is in good spirits, laughing and talking on her phone.  Patient with symptoms consistent with gastritis.  Likely viral in nature.  Vitals are stable, no fever or tachycardia.  Patient is nontoxic, nonseptic appearing, in no apparent  distress.  Patient does not meet the SIRS or Sepsis criteria.  Pt's symptoms have been managed in the department; fluid bolus given.  No signs of dehydration, tolerating PO fluids > 6 oz.  Lungs are clear.  No focal abdominal pain, no peritoneal signs, no concern for appendicitis, cholecystitis, pancreatitis, ruptured viscus, UTI, kidney stone, PID, ectopic pregnancy.  Supportive therapy indicated.  Patient counseled, expresses understanding and agrees with plan.    Dahlia ClientHannah Dewane Timson, PA-C 03/31/15 2136  Geoffery Lyonsouglas Delo, MD 03/31/15 2233

## 2015-06-28 NOTE — Telephone Encounter (Signed)
Erroneous encounter

## 2015-11-06 ENCOUNTER — Inpatient Hospital Stay (HOSPITAL_COMMUNITY)
Admission: AD | Admit: 2015-11-06 | Discharge: 2015-11-06 | Disposition: A | Discharge status: Home / Self Care | Payer: BLUE CROSS/BLUE SHIELD | Source: Ambulatory Visit | Attending: Obstetrics and Gynecology | Admitting: Obstetrics and Gynecology

## 2015-11-06 ENCOUNTER — Encounter (HOSPITAL_COMMUNITY): Payer: Self-pay

## 2015-11-06 ENCOUNTER — Inpatient Hospital Stay (HOSPITAL_COMMUNITY): Payer: BLUE CROSS/BLUE SHIELD

## 2015-11-06 ENCOUNTER — Other Ambulatory Visit (HOSPITAL_COMMUNITY): Payer: BLUE CROSS/BLUE SHIELD

## 2015-11-06 DIAGNOSIS — O210 Mild hyperemesis gravidarum: Secondary | ICD-10-CM | POA: Insufficient documentation

## 2015-11-06 DIAGNOSIS — O9989 Other specified diseases and conditions complicating pregnancy, childbirth and the puerperium: Secondary | ICD-10-CM

## 2015-11-06 DIAGNOSIS — O26899 Other specified pregnancy related conditions, unspecified trimester: Secondary | ICD-10-CM

## 2015-11-06 DIAGNOSIS — Z3491 Encounter for supervision of normal pregnancy, unspecified, first trimester: Secondary | ICD-10-CM

## 2015-11-06 DIAGNOSIS — N83202 Unspecified ovarian cyst, left side: Secondary | ICD-10-CM | POA: Insufficient documentation

## 2015-11-06 DIAGNOSIS — O3481 Maternal care for other abnormalities of pelvic organs, first trimester: Secondary | ICD-10-CM | POA: Diagnosis not present

## 2015-11-06 DIAGNOSIS — F17210 Nicotine dependence, cigarettes, uncomplicated: Secondary | ICD-10-CM | POA: Diagnosis not present

## 2015-11-06 DIAGNOSIS — R109 Unspecified abdominal pain: Secondary | ICD-10-CM | POA: Diagnosis not present

## 2015-11-06 DIAGNOSIS — O99331 Smoking (tobacco) complicating pregnancy, first trimester: Secondary | ICD-10-CM | POA: Diagnosis not present

## 2015-11-06 DIAGNOSIS — Z3A01 Less than 8 weeks gestation of pregnancy: Secondary | ICD-10-CM | POA: Insufficient documentation

## 2015-11-06 DIAGNOSIS — O219 Vomiting of pregnancy, unspecified: Secondary | ICD-10-CM | POA: Diagnosis not present

## 2015-11-06 LAB — WET PREP, GENITAL
Sperm: NONE SEEN
Trich, Wet Prep: NONE SEEN
YEAST WET PREP: NONE SEEN

## 2015-11-06 LAB — COMPREHENSIVE METABOLIC PANEL
ALT: 12 U/L — ABNORMAL LOW (ref 14–54)
ANION GAP: 6 (ref 5–15)
AST: 13 U/L — ABNORMAL LOW (ref 15–41)
Albumin: 3.6 g/dL (ref 3.5–5.0)
Alkaline Phosphatase: 34 U/L — ABNORMAL LOW (ref 38–126)
BUN: 10 mg/dL (ref 6–20)
CHLORIDE: 104 mmol/L (ref 101–111)
CO2: 25 mmol/L (ref 22–32)
CREATININE: 0.75 mg/dL (ref 0.44–1.00)
Calcium: 8.7 mg/dL — ABNORMAL LOW (ref 8.9–10.3)
Glucose, Bld: 83 mg/dL (ref 65–99)
POTASSIUM: 3.5 mmol/L (ref 3.5–5.1)
SODIUM: 135 mmol/L (ref 135–145)
Total Bilirubin: 0.5 mg/dL (ref 0.3–1.2)
Total Protein: 7.3 g/dL (ref 6.5–8.1)

## 2015-11-06 LAB — URINALYSIS, ROUTINE W REFLEX MICROSCOPIC
BILIRUBIN URINE: NEGATIVE
Glucose, UA: NEGATIVE mg/dL
Hgb urine dipstick: NEGATIVE
KETONES UR: NEGATIVE mg/dL
LEUKOCYTES UA: NEGATIVE
NITRITE: NEGATIVE
PROTEIN: NEGATIVE mg/dL
Specific Gravity, Urine: >1.03 — ABNORMAL HIGH (ref 1.005–1.030)
pH: 5.5 (ref 5.0–8.0)

## 2015-11-06 LAB — CBC
HCT: 31.7 % — ABNORMAL LOW (ref 36.0–46.0)
HEMOGLOBIN: 10.7 g/dL — AB (ref 12.0–15.0)
MCH: 25.7 pg — ABNORMAL LOW (ref 26.0–34.0)
MCHC: 33.8 g/dL (ref 30.0–36.0)
MCV: 76.2 fL — ABNORMAL LOW (ref 78.0–100.0)
PLATELETS: 166 10*3/uL (ref 150–400)
RBC: 4.16 MIL/uL (ref 3.87–5.11)
RDW: 15.5 % (ref 11.5–15.5)
WBC: 6 10*3/uL (ref 4.0–10.5)

## 2015-11-06 LAB — HCG, QUANTITATIVE, PREGNANCY: HCG, BETA CHAIN, QUANT, S: 33503 m[IU]/mL — AB (ref <5)

## 2015-11-06 LAB — POCT PREGNANCY, URINE: PREG TEST UR: POSITIVE — AB

## 2015-11-06 MED ORDER — PROMETHAZINE HCL 25 MG PO TABS
25.0000 mg | ORAL_TABLET | Freq: Once | ORAL | Status: AC
  Administered 2015-11-06: 25 mg via ORAL
  Filled 2015-11-06: qty 1

## 2015-11-06 MED ORDER — PROMETHAZINE HCL 12.5 MG PO TABS: 12.5000 mg | ORAL_TABLET | Freq: Four times a day (QID) | ORAL | 0 refills | Status: DC | PRN

## 2015-11-06 NOTE — MAU Note (Signed)
Pt c/o nausea and vomiting since last week. Pt states she hasn't been able to keep anything down. Pt hasn't tried to eat anything today. Pt ate yesterday but the food didn't stay down. Pt c/o lower abdominal cramping for the last week that feels like a period cramp.

## 2015-11-06 NOTE — MAU Provider Note (Signed)
History     CSN: [id number]  Arrival date and time: 11/06/15 1522   First Provider Initiated Contact with Patient 11/06/15 1610      Chief Complaint  Patient presents with  . Nausea  . Abdominal Pain   HPI Sharon Stone is a 25 y.o. G3P2002 at [redacted]w[redacted]d who presents with abdominal cramping & nausea. Reports nausea & vomiting for the last week. States she hasn't been able to keep anything down today. Denies fever/chills, heartburn, diarrhea, or constipation. Also reports lower abdominal cramping for the last week that feels like menstrual cramps. Rates pain 5/10. Has not treated. Denies vaginal bleeding or vaginal discharge.   OB History    Gravida Para Term Preterm AB Living   3 2 2     2    SAB TAB Ectopic Multiple Live Births         0 2      Past Medical History:  Diagnosis Date  . Asthma     Past Surgical History:  Procedure Laterality Date  . CESAREAN SECTION N/A 12/02/2014   Procedure: CESAREAN SECTION;  Surgeon: Tilda Burrow, MD;  Location: [facility];  Service: Obstetrics;  Laterality: N/A;    Family History  Problem Relation Age of Onset  . Cancer Mother   . Diabetes Maternal Aunt   . Heart disease Maternal Grandmother   . Diabetes Maternal Grandmother   . Cancer Maternal Grandmother     Social History  Substance Use Topics  . Smoking status: Light Tobacco Smoker    Types: Cigarettes  . Smokeless tobacco: Never Used  . Alcohol use No    Allergies:  Allergies  Allergen Reactions  . Shellfish Allergy Anaphylaxis    Prescriptions Prior to Admission  Medication Sig Dispense Refill Last Dose  . loperamide (IMODIUM) 2 MG capsule Take 1 capsule (2 mg total) by mouth 4 (four) times daily as needed for diarrhea or loose stools. 12 capsule 0   . ondansetron (ZOFRAN ODT) 8 MG disintegrating tablet 8mg  ODT q4 hours prn nausea 5 tablet 0     Review of Systems  Constitutional: Negative.   Gastrointestinal: Positive for abdominal pain, nausea and vomiting.  Negative for constipation and diarrhea.  Genitourinary: Negative.    Physical Exam   Blood pressure 123/71, pulse 101, temperature 98.7 F (37.1 C), temperature source Oral, resp. rate 17, height 5\' 5"  (1.651 m), weight 273 lb 12 oz (124.2 kg), last menstrual period 09/22/2015, SpO2 100 %, not currently breastfeeding.  Physical Exam  Nursing note and vitals reviewed. Constitutional: She is oriented to person, place, and time. She appears well-developed and well-nourished. No distress.  HENT:  Head: Normocephalic and atraumatic.  Eyes: Conjunctivae are normal. Right eye exhibits no discharge. Left eye exhibits no discharge. No scleral icterus.  Neck: Normal range of motion.  Respiratory: Effort normal. No respiratory distress.  GI: Soft. There is no tenderness.  Genitourinary:  Genitourinary Comments: Cervix closed  Neurological: She is alert and oriented to person, place, and time.  Skin: Skin is warm and dry. She is not diaphoretic.  Psychiatric: She has a normal mood and affect. Her behavior is normal. Judgment and thought content normal.    MAU Course  Procedures Results for orders placed or performed during the hospital encounter of 11/06/15 (from the past 24 hour(s))  Urinalysis, Routine w reflex microscopic (not at [facility])     Status: Abnormal   Collection Time: 11/06/15  3:40 PM  Result Value Ref Range   Color,  Urine YELLOW YELLOW   APPearance HAZY (A) CLEAR   Specific Gravity, Urine >1.030 (H) 1.005 - 1.030   pH 5.5 5.0 - 8.0   Glucose, UA NEGATIVE NEGATIVE mg/dL   Hgb urine dipstick NEGATIVE NEGATIVE   Bilirubin Urine NEGATIVE NEGATIVE   Ketones, ur NEGATIVE NEGATIVE mg/dL   Protein, ur NEGATIVE NEGATIVE mg/dL   Nitrite NEGATIVE NEGATIVE   Leukocytes, UA NEGATIVE NEGATIVE  Pregnancy, urine POC     Status: Abnormal   Collection Time: 11/06/15  3:53 PM  Result Value Ref Range   Preg Test, Ur POSITIVE (A) NEGATIVE  CBC     Status: Abnormal   Collection Time: 11/06/15   4:16 PM  Result Value Ref Range   WBC 6.0 4.0 - 10.5 K/uL   RBC 4.16 3.87 - 5.11 MIL/uL   Hemoglobin 10.7 (L) 12.0 - 15.0 g/dL   HCT 57.831.7 (L) 46.936.0 - 62.946.0 %   MCV 76.2 (L) 78.0 - 100.0 fL   MCH 25.7 (L) 26.0 - 34.0 pg   MCHC 33.8 30.0 - 36.0 g/dL   RDW 52.815.5 41.311.5 - 24.415.5 %   Platelets 166 150 - 400 K/uL  hCG, quantitative, pregnancy     Status: Abnormal   Collection Time: 11/06/15  4:16 PM  Result Value Ref Range   hCG, Beta Chain, Quant, S 33,503 (H) <5 mIU/mL  Comprehensive metabolic panel     Status: Abnormal   Collection Time: 11/06/15  4:16 PM  Result Value Ref Range   Sodium 135 135 - 145 mmol/L   Potassium 3.5 3.5 - 5.1 mmol/L   Chloride 104 101 - 111 mmol/L   CO2 25 22 - 32 mmol/L   Glucose, Bld 83 65 - 99 mg/dL   BUN 10 6 - 20 mg/dL   Creatinine, Ser 0.100.75 0.44 - 1.00 mg/dL   Calcium 8.7 (L) 8.9 - 10.3 mg/dL   Total Protein 7.3 6.5 - 8.1 g/dL   Albumin 3.6 3.5 - 5.0 g/dL   AST 13 (L) 15 - 41 U/L   ALT 12 (L) 14 - 54 U/L   Alkaline Phosphatase 34 (L) 38 - 126 U/L   Total Bilirubin 0.5 0.3 - 1.2 mg/dL   GFR calc non Af Amer >60 >60 mL/min   GFR calc Af Amer >60 >60 mL/min   Anion gap 6 5 - 15  Wet prep, genital     Status: Abnormal   Collection Time: 11/06/15  4:32 PM  Result Value Ref Range   Yeast Wet Prep HPF POC NONE SEEN NONE SEEN   Trich, Wet Prep NONE SEEN NONE SEEN   Clue Cells Wet Prep HPF POC PRESENT (A) NONE SEEN   WBC, Wet Prep HPF POC FEW (A) NONE SEEN   Sperm NONE SEEN    [place] Ob Comp Less 14 Wks  Result Date: 11/06/2015 CLINICAL DATA:  Abdominal pain in first trimester pregnancy. Gestational age by LMP of 6 weeks 3 days. EXAM: OBSTETRIC <14 WK US AND TRANSVAGINAL OB US TECHNIQUE: Both transabdominal and transvaginal ultrasound examinations were performed for complete evaluation of the gestation as well as the maternal uterus, adnexal regions, and pelvic cul-de-sac. Transvaginal technique was performed to assess early pregnancy. COMPARISON:  None.  FINDINGS: Intrauterine gestational sac: Single Yolk sac:  Visualized Embryo:  Visualized Cardiac Activity: Visualized Heart Rate: 110  bpm CRL:  2  mm   5 w   5 d  [place] EDC: 07/03/2016 Subchorionic hemorrhage:  Probable small subchorionic hemorrhage. Maternal uterus/adnexae: Small posterior uterine fibroid measuring 1.7 cm. Normal appearance of left ovary. 5.5 cm complex left ovarian cyst which appears to contain central blood clot with concave margins. Small amount of simple free fluid. IMPRESSION: Single living IUP measuring 5 weeks 5 days with [place] EDC of 07/03/2016. Small posterior uterine fibroid and probable small subchorionic hemorrhage. 5.5 cm complex left ovarian cyst, most likely representing a hemorrhagic cyst. Recommend continued followup by ultrasound in 6 weeks. Electronically Signed   By: Myles Rosenthal M.D.   On: 11/06/2015 17:29   US Ob Transvaginal  Result Date: 11/06/2015 CLINICAL DATA:  Abdominal pain in first trimester pregnancy. Gestational age by LMP of 6 weeks 3 days. EXAM: OBSTETRIC <14 WK [place] AND TRANSVAGINAL OB US TECHNIQUE: Both transabdominal and transvaginal ultrasound examinations were performed for complete evaluation of the gestation as well as the maternal uterus, adnexal regions, and pelvic cul-de-sac. Transvaginal technique was performed to assess early pregnancy. COMPARISON:  None. FINDINGS: Intrauterine gestational sac: Single Yolk sac:  Visualized Embryo:  Visualized Cardiac Activity: Visualized Heart Rate: 110  bpm CRL:  2  mm   5 w   5 d                  [place] EDC: 07/03/2016 Subchorionic hemorrhage:  Probable small subchorionic hemorrhage. Maternal uterus/adnexae: Small posterior uterine fibroid measuring 1.7 cm. Normal appearance of left ovary. 5.5 cm complex left ovarian cyst which appears to contain central blood clot with concave margins. Small amount of simple free fluid. IMPRESSION: Single living IUP measuring 5 weeks 5 days with [place] EDC of 07/03/2016. Small  posterior uterine fibroid and probable small subchorionic hemorrhage. 5.5 cm complex left ovarian cyst, most likely representing a hemorrhagic cyst. Recommend continued followup by ultrasound in 6 weeks. Electronically Signed   By: Myles Rosenthal M.D.   On: 11/06/2015 17:29    MDM +UPT UA, wet prep, GC/chlamydia, CBC, ABO/Rh, quant hCG, HIV, and [place] today to rule out ectopic pregnancy  Ultrasound shows SIUP with cardiac activity & left ovarian cyst Pt not observed vomiting while in MAU & request to be discharged so she can make the bus  Assessment and Plan  A: 1. Normal IUP (intrauterine pregnancy) on prenatal ultrasound, first trimester   2. Abdominal pain affecting pregnancy   3. Cyst of left ovary   4. Nausea and vomiting during pregnancy prior to [redacted] weeks gestation    P: Discharge home Take tylenol prn pain Rx phenergan Ob/gyn provider list given Start prenatal care Discussed reasons to return to MAU  Judeth Horn 11/06/2015, 4:10 PM

## 2015-11-06 NOTE — Discharge Instructions (Signed)
Morning Sickness Morning sickness is when you feel sick to your stomach (nauseous) during pregnancy. This nauseous feeling may or may not come with vomiting. It often occurs in the morning but can be a problem any time of day. Morning sickness is most common during the first trimester, but it may continue throughout pregnancy. While morning sickness is unpleasant, it is usually harmless unless you develop severe and continual vomiting (hyperemesis gravidarum). This condition requires more intense treatment.  CAUSES  The cause of morning sickness is not completely known but seems to be related to normal hormonal changes that occur in pregnancy. RISK FACTORS You are at greater risk if you:  Experienced nausea or vomiting before your pregnancy.  Had morning sickness during a previous pregnancy.  Are pregnant with more than one baby, such as twins. TREATMENT  Do not use any medicines (prescription, over-the-counter, or herbal) for morning sickness without first talking to your health care provider. Your health care provider may prescribe or recommend:  Vitamin B6 supplements.  Anti-nausea medicines.  The herbal medicine ginger. HOME CARE INSTRUCTIONS   Only take over-the-counter or prescription medicines as directed by your health care provider.  Taking multivitamins before getting pregnant can prevent or decrease the severity of morning sickness in most women.  Eat a piece of dry toast or unsalted crackers before getting out of bed in the morning.  Eat five or six small meals a day.  Eat dry and bland foods (rice, baked potato). Foods high in carbohydrates are often helpful.  Do not drink liquids with your meals. Drink liquids between meals.  Avoid greasy, fatty, and spicy foods.  Get someone to cook for you if the smell of any food causes nausea and vomiting.  If you feel nauseous after taking prenatal vitamins, take the vitamins at night or with a snack.  Snack on protein  foods (nuts, yogurt, cheese) between meals if you are hungry.  Eat unsweetened gelatins for desserts.  Wearing an acupressure wristband (worn for sea sickness) may be helpful.  Acupuncture may be helpful.  Do not smoke.  Get a humidifier to keep the air in your house free of odors.  Get plenty of fresh air. SEEK MEDICAL CARE IF:   Your home remedies are not working, and you need medicine.  You feel dizzy or lightheaded.  You are losing weight. SEEK IMMEDIATE MEDICAL CARE IF:   You have persistent and uncontrolled nausea and vomiting.  You pass out (faint). MAKE SURE YOU:  Understand these instructions.  Will watch your condition.  Will get help right away if you are not doing well or get worse.   This information is not intended to replace advice given to you by your health care provider. Make sure you discuss any questions you have with your health care provider.   Document Released: 03/06/2006 Document Revised: 01/18/2013 Document Reviewed: 06/30/2012 Elsevier Interactive Patient Education 2016 Else[employer] Inc[employer] Abdominal Pain During Pregnancy Belly (abdominal) pain is common during pregnancy. Most of the time, it is not a serious problem. Other times, it can be a sign that something is wrong with the pregnancy. Always tell your doctor if you have belly pain. HOME CARE Monitor your belly pain for any changes. The following actions may help you feel better:  Do not have sex (intercourse) or put anything in your vagina until you feel better.  Rest until your pain stops.  Drink clear fluids if you feel sick to your stomach (nauseous). Do not eat solid food  until you feel better.  Only take medicine as told by your doctor.  Keep all doctor visits as told. GET HELP RIGHT AWAY IF:   You are bleeding, leaking fluid, or pieces of tissue come out of your vagina.  You have more pain or cramping.  You keep throwing up (vomiting).  You have pain when you pee (urinate) or  have blood in your pee.  You have a fever.  You do not feel your baby moving as much.  You feel very weak or feel like passing out.  You have trouble breathing, with or without belly pain.  You have a very bad headache and belly pain.  You have fluid leaking from your vagina and belly pain.  You keep having watery poop (diarrhea).  Your belly pain does not go away after resting, or the pain gets worse. MAKE SURE YOU:   Understand these instructions.  Will watch your condition.  Will get help right away if you are not doing well or get worse.   This information is not intended to replace advice given to you by your health care provider. Make sure you discuss any questions you have with your health care provider.   Document Released: 01/01/2009 Document Revised: 09/15/2012 Document Reviewed: 08/12/2012 Elsevier Interactive Patient Education 2016 Else[employer] Inc[employer] Ovarian Cyst An ovarian cyst is a fluid-filled sac that forms on an ovary. The ovaries are small organs that produce eggs in women. Various types of cysts can form on the ovaries. Most are not cancerous. Many do not cause problems, and they often go away on their own. Some may cause symptoms and require treatment. Common types of ovarian cysts include:  Functional cysts--These cysts may occur every month during the menstrual cycle. This is normal. The cysts usually go away with the next menstrual cycle if the woman does not get pregnant. Usually, there are no symptoms with a functional cyst.  Endometrioma cysts--These cysts form from the tissue that lines the uterus. They are also called "chocolate cysts" because they become filled with blood that turns brown. This type of cyst can cause pain in the lower abdomen during intercourse and with your menstrual period.  Cystadenoma cysts--This type develops from the cells on the outside of the ovary. These cysts can get very big and cause lower abdomen pain and pain with  intercourse. This type of cyst can twist on itself, cut off its blood supply, and cause severe pain. It can also easily rupture and cause a lot of pain.  Dermoid cysts--This type of cyst is sometimes found in both ovaries. These cysts may contain different kinds of body tissue, such as skin, teeth, hair, or cartilage. They usually do not cause symptoms unless they get very big.  Theca lutein cysts--These cysts occur when too much of a certain hormone (human chorionic gonadotropin) is produced and overstimulates the ovaries to produce an egg. This is most common after procedures used to assist with the conception of a baby (in vitro fertilization). CAUSES   Fertility drugs can cause a condition in which multiple large cysts are formed on the ovaries. This is called ovarian hyperstimulation syndrome.  A condition called polycystic ovary syndrome can cause hormonal imbalances that can lead to nonfunctional ovarian cysts. SIGNS AND SYMPTOMS  Many ovarian cysts do not cause symptoms. If symptoms are present, they may include:  Pelvic pain or pressure.  Pain in the lower abdomen.  Pain during sexual intercourse.  Increasing girth (swelling) of the abdomen.  Abnormal  menstrual periods.  Increasing pain with menstrual periods.  Stopping having menstrual periods without being pregnant. DIAGNOSIS  These cysts are commonly found during a routine or annual pelvic exam. Tests may be ordered to find out more about the cyst. These tests may include:  Ultrasound.  X-ray of the pelvis.  CT scan.  MRI.  Blood tests. TREATMENT  Many ovarian cysts go away on their own without treatment. Your health care provider may want to check your cyst regularly for 2-3 months to see if it changes. For women in menopause, it is particularly important to monitor a cyst closely because of the higher rate of ovarian cancer in menopausal women. When treatment is needed, it may include any of the following:  A  procedure to drain the cyst (aspiration). This may be done using a long needle and ultrasound. It can also be done through a laparoscopic procedure. This involves using a thin, lighted tube with a tiny camera on the end (laparoscope) inserted through a small incision.  Surgery to remove the whole cyst. This may be done using laparoscopic surgery or an open surgery involving a larger incision in the lower abdomen.  Hormone treatment or birth control pills. These methods are sometimes used to help dissolve a cyst. HOME CARE INSTRUCTIONS   Only take over-the-counter or prescription medicines as directed by your health care provider.  Follow up with your health care provider as directed.  Get regular pelvic exams and Pap tests. SEEK MEDICAL CARE IF:   Your periods are late, irregular, or painful, or they stop.  Your pelvic pain or abdominal pain does not go away.  Your abdomen becomes larger or swollen.  You have pressure on your bladder or trouble emptying your bladder completely.  You have pain during sexual intercourse.  You have feelings of fullness, pressure, or discomfort in your stomach.  You lose weight for no apparent reason.  You feel generally ill.  You become constipated.  You lose your appetite.  You develop acne.  You have an increase in body and facial hair.  You are gaining weight, without changing your exercise and eating habits.  You think you are pregnant. SEEK IMMEDIATE MEDICAL CARE IF:   You have increasing abdominal pain.  You feel sick to your stomach (nauseous), and you throw up (vomit).  You develop a fever that comes on suddenly.  You have abdominal pain during a bowel movement.  Your menstrual periods become heavier than usual. MAKE SURE YOU:  Understand these instructions.  Will watch your condition.  Will get help right away if you are not doing well or get worse.   This information is not intended to replace advice given to you by  your health care provider. Make sure you discuss any questions you have with your health care provider.   Document Released: 01/13/2005 Document Revised: 01/18/2013 Document Reviewed: 09/20/2012 Elsevier Interactive Patient Education [employer]

## 2015-11-07 LAB — GC/CHLAMYDIA PROBE AMP (~~LOC~~) NOT AT ARMC
CHLAMYDIA, DNA PROBE: NEGATIVE
Neisseria Gonorrhea: NEGATIVE

## 2015-11-07 LAB — HIV ANTIBODY (ROUTINE TESTING W REFLEX): HIV Screen 4th Generation wRfx: NONREACTIVE

## 2016-01-25 ENCOUNTER — Emergency Department (HOSPITAL_COMMUNITY)
Admission: EM | Admit: 2016-01-25 | Discharge: 2016-01-25 | Disposition: A | Discharge status: Home / Self Care | Payer: Medicaid Other | Attending: Emergency Medicine | Admitting: Emergency Medicine

## 2016-01-25 ENCOUNTER — Encounter (HOSPITAL_COMMUNITY): Payer: Self-pay | Admitting: Neurology

## 2016-01-25 DIAGNOSIS — O99332 Smoking (tobacco) complicating pregnancy, second trimester: Secondary | ICD-10-CM | POA: Insufficient documentation

## 2016-01-25 DIAGNOSIS — J40 Bronchitis, not specified as acute or chronic: Secondary | ICD-10-CM | POA: Diagnosis not present

## 2016-01-25 DIAGNOSIS — F17210 Nicotine dependence, cigarettes, uncomplicated: Secondary | ICD-10-CM | POA: Diagnosis not present

## 2016-01-25 DIAGNOSIS — O99512 Diseases of the respiratory system complicating pregnancy, second trimester: Secondary | ICD-10-CM | POA: Diagnosis present

## 2016-01-25 DIAGNOSIS — Z3A17 17 weeks gestation of pregnancy: Secondary | ICD-10-CM | POA: Insufficient documentation

## 2016-01-25 LAB — URINALYSIS, ROUTINE W REFLEX MICROSCOPIC
BACTERIA UA: NONE SEEN
Bilirubin Urine: NEGATIVE
Glucose, UA: NEGATIVE mg/dL
HGB URINE DIPSTICK: NEGATIVE
Ketones, ur: 80 mg/dL — AB
Leukocytes, UA: NEGATIVE
NITRITE: NEGATIVE
PROTEIN: 100 mg/dL — AB
SPECIFIC GRAVITY, URINE: 1.027 (ref 1.005–1.030)
pH: 5 (ref 5.0–8.0)

## 2016-01-25 LAB — CBC
HEMATOCRIT: 30.6 % — AB (ref 36.0–46.0)
HEMOGLOBIN: 10.3 g/dL — AB (ref 12.0–15.0)
MCH: 25.7 pg — AB (ref 26.0–34.0)
MCHC: 33.7 g/dL (ref 30.0–36.0)
MCV: 76.3 fL — AB (ref 78.0–100.0)
Platelets: 134 10*3/uL — ABNORMAL LOW (ref 150–400)
RBC: 4.01 MIL/uL (ref 3.87–5.11)
RDW: 15.1 % (ref 11.5–15.5)
WBC: 8.7 10*3/uL (ref 4.0–10.5)

## 2016-01-25 LAB — BASIC METABOLIC PANEL
ANION GAP: 8 (ref 5–15)
CHLORIDE: 104 mmol/L (ref 101–111)
CO2: 21 mmol/L — AB (ref 22–32)
Calcium: 8.6 mg/dL — ABNORMAL LOW (ref 8.9–10.3)
Creatinine, Ser: 0.65 mg/dL (ref 0.44–1.00)
GFR calc Af Amer: >60 mL/min (ref >60)
GFR calc non Af Amer: >60 mL/min (ref >60)
GLUCOSE: 93 mg/dL (ref 65–99)
POTASSIUM: 3.4 mmol/L — AB (ref 3.5–5.1)
Sodium: 133 mmol/L — ABNORMAL LOW (ref 135–145)

## 2016-01-25 MED ORDER — IPRATROPIUM-ALBUTEROL 0.5-2.5 (3) MG/3ML IN SOLN
3.0000 mL | Freq: Once | RESPIRATORY_TRACT | Status: AC
  Administered 2016-01-25: 3 mL via RESPIRATORY_TRACT
  Filled 2016-01-25: qty 3

## 2016-01-25 MED ORDER — AZITHROMYCIN 250 MG PO TABS: 250.0000 mg | ORAL_TABLET | Freq: Every day | ORAL | 0 refills | Status: DC

## 2016-01-25 MED ORDER — ALBUTEROL SULFATE HFA 108 (90 BASE) MCG/ACT IN AERS
2.0000 | INHALATION_SPRAY | RESPIRATORY_TRACT | Status: DC | PRN
  Administered 2016-01-25: 2 via RESPIRATORY_TRACT
  Filled 2016-01-25: qty 6.7

## 2016-01-25 MED ORDER — BUDESONIDE-FORMOTEROL FUMARATE 80-4.5 MCG/ACT IN AERO: 2.0000 | INHALATION_SPRAY | Freq: Every day | RESPIRATORY_TRACT | 1 refills | Status: DC

## 2016-01-25 MED ORDER — PREDNISONE 10 MG PO TABS: 20.0000 mg | ORAL_TABLET | Freq: Two times a day (BID) | ORAL | 0 refills | Status: DC

## 2016-01-25 NOTE — ED Provider Notes (Signed)
[facility]-EMERGENCY DEPT Provider Note   CSN: [id number] Arrival date & time: 01/25/16  1424  By signing my name below, I, Modena Jansky, attest that this documentation has been prepared under the direction and in the presence of non-physician practitioner, Kerrie Buffalo, NP. Electronically Signed: Modena Jansky, Scribe. 01/25/2016. 4:30 PM.  History   Chief Complaint Chief Complaint  Patient presents with  . Cough  . Weakness   The history is provided by the patient. No language interpreter was used.  Cough  This is a new problem. The current episode started more than 2 days ago. The problem occurs constantly. The problem has not changed since onset.The cough is productive of sputum. There has been no fever. Associated symptoms include chest pain (with cough only). Pertinent negatives include no sore throat. She has tried nothing for the symptoms. Her past medical history is significant for asthma.   HPI Comments: Sharon Stone is a 25 y.o. female with a PMHx of asthma who presents to the Emergency Department complaining of intermittent moderate cough that started about a week ago.[id number] @ [redacted]w[redacted]d. She reports no treatment PTA since she has no albuterol inhaler at home. She has no modifying factors. She reports associated chest pain and fatigue. She denies any sore throat, dysuria, back pain, extremity swelling, nausea, vomiting, or other complaints.    Past Medical History:  Diagnosis Date  . Asthma     Patient Active Problem List   Diagnosis Date Noted  . Post term pregnancy, 41 weeks 12/02/2014  . Evaluate anatomy not seen on prior sonogram   . Obesity   . [redacted] weeks gestation of pregnancy   . Tobacco smoking affecting pregnancy   . Encounter for fetal anatomic survey   . [redacted] weeks gestation of pregnancy   . Maternal morbid obesity, antepartum (HCC)   . Encounter for (NT) nuchal translucency scan   . [redacted] weeks gestation of pregnancy     Past Surgical History:  Procedure  Laterality Date  . CESAREAN SECTION N/A 12/02/2014   Procedure: CESAREAN SECTION;  Surgeon: Tilda Burrow, MD;  Location: [facility];  Service: Obstetrics;  Laterality: N/A;    OB History    Gravida Para Term Preterm AB Living   3 2 2     2    SAB TAB Ectopic Multiple Live Births         0 2       Home Medications    Prior to Admission medications   Medication Sig Start Date End Date Taking? Authorizing Provider  azithromycin (ZITHROMAX) 250 MG tablet Take 1 tablet (250 mg total) by mouth daily. Take first 2 tablets together, then 1 every day until finished. 01/25/16   Sandara Tyree Orlene Och, NP  budesonide-formoterol (SYMBICORT) 80-4.5 MCG/ACT inhaler Inhale 2 puffs into the lungs daily. 01/25/16   Dimples Probus Orlene Och, NP  diphenhydramine-acetaminophen (TYLENOL PM) 25-500 MG TABS tablet Take 1 tablet by mouth at bedtime as needed.    Historical Provider, MD  EPINEPHrine (EPIPEN 2-PAK) 0.3 mg/0.3 mL IJ SOAJ injection Inject 0.3 mg into the muscle once.    Historical Provider, MD  Multiple Vitamin (MULTIVITAMIN WITH MINERALS) TABS tablet Take 1 tablet by mouth daily.    Historical Provider, MD  predniSONE (DELTASONE) 10 MG tablet Take 2 tablets (20 mg total) by mouth 2 (two) times daily with a meal. 01/25/16   Hattye Siegfried Orlene Och, NP  promethazine (PHENERGAN) 12.5 MG tablet Take 1 tablet (12.5 mg total) by mouth every 6 (six) hours  as needed for nausea or vomiting. 11/06/15   Judeth HornErin Lawrence, NP    Family History Family History  Problem Relation Age of Onset  . Cancer Mother   . Diabetes Maternal Aunt   . Heart disease Maternal Grandmother   . Diabetes Maternal Grandmother   . Cancer Maternal Grandmother     Social History Social History  Substance Use Topics  . Smoking status: Current Every Day Smoker    Types: Cigarettes  . Smokeless tobacco: Never Used  . Alcohol use Yes     Allergies   Shellfish allergy   Review of Systems Review of Systems  Constitutional: Positive for fatigue.  HENT:  Negative for sore throat.   Respiratory: Positive for cough.   Cardiovascular: Positive for chest pain (with cough only). Negative for leg swelling.  Gastrointestinal: Negative for abdominal pain, nausea and vomiting.  Genitourinary: Negative for dysuria.  Musculoskeletal: Negative for back pain.  All other systems reviewed and are negative.    Physical Exam Updated Vital Signs BP 130/68 (BP Location: Right Arm)   Pulse 106   Temp 98.4 F (36.9 C) (Oral)   Resp 18   LMP 09/22/2015 (Exact Date)   SpO2 100%   Physical Exam  Constitutional: She is oriented to person, place, and time. She appears well-developed and well-nourished. No distress.  HENT:  Head: Normocephalic.  Right Ear: No mastoid tenderness.  Left Ear: No mastoid tenderness.  Nose: Rhinorrhea present.  Mouth/Throat: Uvula is midline and mucous membranes are normal. Posterior oropharyngeal erythema (Mild) present. No posterior oropharyngeal edema.  TMs occluded by cerumen.   Eyes: Conjunctivae and EOM are normal. Pupils are equal, round, and reactive to light. No scleral icterus.  Neck: Neck supple.  Cardiovascular: Regular rhythm.  Tachycardia present.   Pulmonary/Chest: Effort normal. She has wheezes.  Inspiratory wheezes and slightly decreased breath sounds bilaterally.   Abdominal: Soft. Bowel sounds are normal. There is no tenderness.  Doppler fetal heart tone is 150.   Musculoskeletal: Normal range of motion.  Lymphadenopathy:    She has no cervical adenopathy.  Neurological: She is alert and oriented to person, place, and time.  Skin: Skin is warm and dry.  Psychiatric: She has a normal mood and affect. Her behavior is normal.  Nursing note and vitals reviewed.    ED Treatments / Results  DIAGNOSTIC STUDIES: Oxygen Saturation is 100% on RA, normal by my interpretation.    COORDINATION OF CARE: 4:36 PM- Pt advised of plan for treatment and pt agrees.  5:37 PM- [facility] and he agrees with plan of care.   Labs (all labs ordered are listed, but only abnormal results are displayed) Labs Reviewed  BASIC METABOLIC PANEL - Abnormal; Notable for the following:       Result Value   Sodium 133 (*)    Potassium 3.4 (*)    CO2 21 (*)    BUN <5 (*)    Calcium 8.6 (*)    All other components within normal limits  CBC - Abnormal; Notable for the following:    Hemoglobin 10.3 (*)    HCT 30.6 (*)    MCV 76.3 (*)    MCH 25.7 (*)    Platelets 134 (*)    All other components within normal limits  URINALYSIS, ROUTINE W REFLEX MICROSCOPIC - Abnormal; Notable for the following:    APPearance CLOUDY (*)    Ketones, ur 80 (*)    Protein, ur 100 (*)  Squamous Epithelial / LPF 6-30 (*)    All other components within normal limits   Radiology No results found.  Procedures Procedures (including critical care time) Re evaluated after neb treatment and patient breath sounds have improved and she states she is feeling better. Will plan for d/c with prednisone, Albuterol Inhaler and Z-pak. She will follow up with her doctor or return for worsening symptoms.   Medications Ordered in ED Medications  ipratropium-albuterol (DUONEB) 0.5-2.5 (3) MG/3ML nebulizer solution 3 mL (3 mLs Nebulization Given 01/25/16 1648)     Initial Impression / Assessment and Plan / ED Course  I have reviewed the triage vital signs and the nursing notes.  Pertinent lab results that were available during my care of the patient were reviewed by me and considered in my medical decision making (see chart for details).  Clinical Course    5:37 PM- [facility] and he agrees with plan of care.  Final Clinical Impressions(s) / ED Diagnoses   Final diagnoses:  Bronchitis    New Prescriptions Discharge Medication List as of 01/25/2016  6:43 PM    START taking these medications   Details  azithromycin (ZITHROMAX) 250 MG tablet Take 1 tablet (250 mg  total) by mouth daily. Take first 2 tablets together, then 1 every day until finished., Starting Fri 01/25/2016, Print    budesonide-formoterol (SYMBICORT) 80-4.5 MCG/ACT inhaler Inhale 2 puffs into the lungs daily., Starting Fri 01/25/2016, Print    predniSONE (DELTASONE) 10 MG tablet Take 2 tablets (20 mg total) by mouth 2 (two) times daily with a meal., Starting Fri 01/25/2016, Print      I personally performed the services described in this documentation, which was scribed in my presence. The recorded information has been reviewed and is accurate.     GreenevilleHope M Girolamo Lortie, NP 01/26/16 1638    Nira ConnPedro Eduardo Cardama, MD 01/29/16 [id number]

## 2016-01-25 NOTE — ED Triage Notes (Addendum)
Pt reports cough x 1 week with productive yellow sputum. Feels weak all over and no energy. Denies n/v/d/fever. Reports is [redacted] weeks pregnant, no problems with pregnancy.

## 2016-01-25 NOTE — ED Notes (Signed)
Pt verbalizes understanding of discharge instructions and prescription information. NAD. Wheeled to waiting room at discharge.

## 2016-01-28 NOTE — L&D Delivery Note (Signed)
26 y.o. [id number] at 42104w5d delivered a non-viable female infant en caul in cephalic, LOT position. Scant thick malodorous meconium fluid in sac before ROM. +Loose nuchal cordx1 with a true knot. Cord clamped x2 and cut. Placenta delivered spontaneously intact, small placenta with 3VC, cord blood was clotted, could not collect. Fundus firm on exam with massage and pitocin. Good hemostasis noted.  Anesthesia: Epidural Laceration: None Suture: N/A Good hemostasis noted. EBL: 100cc  Mom recovering in LDR.  Appropriately grieving with FOB. Baby in room and then to morgue.  Apgars: APGAR (1 MIN): 0   APGAR (5 MINS): 0    Weight: Pending  Placenta sent to pathology. TORCH titers to be taken. Due to very odorous fluid, will watch mom for 24 hours, currently no sign of fevers or leukocytosis.   Jen MowElizabeth Mumaw, DO [facility] Fellow [facility], [facility] 05/29/2016, 10:13 AM

## 2016-02-19 LAB — OB RESULTS CONSOLE GC/CHLAMYDIA: Gonorrhea: NEGATIVE

## 2016-02-19 LAB — OB RESULTS CONSOLE RUBELLA ANTIBODY, IGM: Rubella: IMMUNE

## 2016-02-19 LAB — OB RESULTS CONSOLE RPR: RPR: NONREACTIVE

## 2016-02-19 LAB — OB RESULTS CONSOLE HEPATITIS B SURFACE ANTIGEN: Hepatitis B Surface Ag: NEGATIVE

## 2016-04-11 ENCOUNTER — Encounter (HOSPITAL_COMMUNITY): Payer: Self-pay | Admitting: *Deleted

## 2016-04-11 ENCOUNTER — Emergency Department (HOSPITAL_COMMUNITY)
Admission: EM | Admit: 2016-04-11 | Discharge: 2016-04-11 | Discharge status: Against Medical Advice | Payer: BLUE CROSS/BLUE SHIELD | Attending: Emergency Medicine | Admitting: Emergency Medicine

## 2016-04-11 DIAGNOSIS — Z3A28 28 weeks gestation of pregnancy: Secondary | ICD-10-CM | POA: Insufficient documentation

## 2016-04-11 DIAGNOSIS — O26892 Other specified pregnancy related conditions, second trimester: Secondary | ICD-10-CM | POA: Insufficient documentation

## 2016-04-11 DIAGNOSIS — F17210 Nicotine dependence, cigarettes, uncomplicated: Secondary | ICD-10-CM | POA: Diagnosis not present

## 2016-04-11 DIAGNOSIS — R0609 Other forms of dyspnea: Secondary | ICD-10-CM

## 2016-04-11 DIAGNOSIS — J45909 Unspecified asthma, uncomplicated: Secondary | ICD-10-CM | POA: Diagnosis not present

## 2016-04-11 DIAGNOSIS — O99332 Smoking (tobacco) complicating pregnancy, second trimester: Secondary | ICD-10-CM | POA: Insufficient documentation

## 2016-04-11 DIAGNOSIS — R079 Chest pain, unspecified: Secondary | ICD-10-CM

## 2016-04-11 LAB — COMPREHENSIVE METABOLIC PANEL
ALK PHOS: 52 U/L (ref 38–126)
ALT: 10 U/L — ABNORMAL LOW (ref 14–54)
AST: 14 U/L — ABNORMAL LOW (ref 15–41)
Albumin: 2.6 g/dL — ABNORMAL LOW (ref 3.5–5.0)
Anion gap: 6 (ref 5–15)
BUN: 5 mg/dL — ABNORMAL LOW (ref 6–20)
CHLORIDE: 108 mmol/L (ref 101–111)
CO2: 22 mmol/L (ref 22–32)
Calcium: 8.6 mg/dL — ABNORMAL LOW (ref 8.9–10.3)
Creatinine, Ser: 0.66 mg/dL (ref 0.44–1.00)
Glucose, Bld: 117 mg/dL — ABNORMAL HIGH (ref 65–99)
POTASSIUM: 3.5 mmol/L (ref 3.5–5.1)
SODIUM: 136 mmol/L (ref 135–145)
Total Bilirubin: 0.2 mg/dL — ABNORMAL LOW (ref 0.3–1.2)
Total Protein: 5.9 g/dL — ABNORMAL LOW (ref 6.5–8.1)

## 2016-04-11 LAB — CBC
HCT: 26.9 % — ABNORMAL LOW (ref 36.0–46.0)
HEMOGLOBIN: 9 g/dL — AB (ref 12.0–15.0)
MCH: 26.5 pg (ref 26.0–34.0)
MCHC: 33.5 g/dL (ref 30.0–36.0)
MCV: 79.1 fL (ref 78.0–100.0)
Platelets: 138 10*3/uL — ABNORMAL LOW (ref 150–400)
RBC: 3.4 MIL/uL — ABNORMAL LOW (ref 3.87–5.11)
RDW: 15.4 % (ref 11.5–15.5)
WBC: 9.2 10*3/uL (ref 4.0–10.5)

## 2016-04-11 LAB — I-STAT TROPONIN, ED: Troponin i, poc: 0 ng/mL (ref 0.00–0.08)

## 2016-04-11 NOTE — ED Notes (Signed)
PT reports she has to pick up her children at 6pm and cannot stay for VQ scan. Nuclear med called to see how long scan would take. PT is made aware. PT elects to not have scan. PT will stay for repeat trop. Dr. Jacqulyn BathLong made aware. PT will sign out AMA after trop. Dr Jacqulyn BathLong in room to speak with PT.

## 2016-04-11 NOTE — ED Notes (Signed)
Fredderick PhenixBelfi, MD aware of pts presentation, per MD pt not to have troponin sent, pt to have basic labs completed until evaluated by MD

## 2016-04-11 NOTE — ED Provider Notes (Signed)
Emergency Department Provider Note   I have reviewed the triage vital signs and the nursing notes.   HISTORY  Chief Complaint Chest Pain   HPI Sharon Stone is a 26 y.o. female with PMH of asthma currently [redacted] weeks pregnant as to the emergent permit for evaluation of sudden onset chest pain and dyspnea with exertion that started today. She denies any obvious inciting event. No history of prior. She tried using her albuterol inhaler with no relief in symptoms this AM. She does note intermittent left leg swelling. She states it was swollen yesterday but is since gone down. She notes that throughout this pregnancy she's had intermittent swelling in the left leg but denies significant pain or redness. She also continues to have nausea and vomiting frequently but states is not worse than normal. No abdominal pain. No vaginal bleeding or discharge. No modifying factors. No history of DVT/PE.    Past Medical History:  Diagnosis Date  . Asthma     Patient Active Problem List   Diagnosis Date Noted  . Post term pregnancy, 41 weeks 12/02/2014  . Evaluate anatomy not seen on prior sonogram   . Obesity   . [redacted] weeks gestation of pregnancy   . Tobacco smoking affecting pregnancy   . Encounter for fetal anatomic survey   . [redacted] weeks gestation of pregnancy   . Maternal morbid obesity, antepartum (HCC)   . Encounter for (NT) nuchal translucency scan   . [redacted] weeks gestation of pregnancy     Past Surgical History:  Procedure Laterality Date  . CESAREAN SECTION N/A 12/02/2014   Procedure: CESAREAN SECTION;  Surgeon: Tilda Burrow, MD;  Location: [facility];  Service: Obstetrics;  Laterality: N/A;    Current Outpatient Rx  . Order #: [id number] Class: Print  . Order #: [id number] Class: Print  . Order #: [id number] Class: Historical Med  . Order #: [id number] Class: Historical Med  . Order #: [id number] Class: Historical Med  . Order #: [id number] Class: Print  . Order #: [id number] Class: Normal     Allergies Shellfish allergy  Family History  Problem Relation Age of Onset  . Cancer Mother   . Diabetes Maternal Aunt   . Heart disease Maternal Grandmother   . Diabetes Maternal Grandmother   . Cancer Maternal Grandmother     Social History Social History  Substance Use Topics  . Smoking status: Current Every Day Smoker    Packs/day: 0.10    Types: Cigarettes  . Smokeless tobacco: Never Used  . Alcohol use Yes    Review of Systems  Constitutional: No fever/chills Eyes: No visual changes. ENT: No sore throat. Cardiovascular: Positive chest pain. Respiratory: Positive shortness of breath. Gastrointestinal: No abdominal pain.  No nausea, no vomiting.  No diarrhea.  No constipation. Genitourinary: Negative for dysuria. Musculoskeletal: Negative for back pain. Positive intermittent LLE edema.  Skin: Negative for rash. Neurological: Negative for headaches, focal weakness or numbness.  10-point ROS otherwise negative.  ____________________________________________   PHYSICAL EXAM:  VITAL SIGNS: ED Triage Vitals  Enc Vitals Group     BP 04/11/16 1345 121/66     Pulse Rate 04/11/16 1345 (!) 118     Resp 04/11/16 1345 (!) 23     Temp 04/11/16 1345 98.1 F (36.7 C)     Temp Source 04/11/16 1345 Oral     SpO2 04/11/16 1345 98 %     Pain Score 04/11/16 1349 6   Constitutional: Alert and oriented. Well appearing and in no acute distress. Eyes:  Conjunctivae are normal.  Head: Atraumatic. Nose: No congestion/rhinnorhea. Mouth/Throat: Mucous membranes are moist.  Oropharynx non-erythematous. Neck: No stridor.  Cardiovascular: Tachycardia. Good peripheral circulation. Grossly normal heart sounds.   Respiratory: Normal respiratory effort.  No retractions. Lungs CTAB. Gastrointestinal: Soft and nontender. No distention.  Musculoskeletal: No lower extremity tenderness nor edema. No gross deformities of extremities. Neurologic:  Normal speech and language. No gross  focal neurologic deficits are appreciated.  Skin:  Skin is warm, dry and intact. No rash noted. Psychiatric: Mood and affect are normal. Speech and behavior are normal.  ____________________________________________   LABS (all labs ordered are listed, but only abnormal results are displayed)  Labs Reviewed  CBC - Abnormal; Notable for the following:       Result Value   RBC 3.40 (*)    Hemoglobin 9.0 (*)    HCT 26.9 (*)    Platelets 138 (*)    All other components within normal limits  COMPREHENSIVE METABOLIC PANEL - Abnormal; Notable for the following:    Glucose, Bld 117 (*)    BUN 5 (*)    Calcium 8.6 (*)    Total Protein 5.9 (*)    Albumin 2.6 (*)    AST 14 (*)    ALT 10 (*)    Total Bilirubin 0.2 (*)    All other components within normal limits  I-STAT TROPOININ, ED   ____________________________________________  EKG   EKG Interpretation  Date/Time:  Friday April 11 2016 13:42:26 EDT Ventricular Rate:  118 PR Interval:  124 QRS Duration: 82 QT Interval:  336 QTC Calculation: 470 R Axis:   46 Text Interpretation:  Sinus tachycardia Possible Inferior infarct , age undetermined Possible Anterolateral infarct , age undetermined Abnormal ECG No STEMI.  Confirmed by Tationna Fullard MD, Skye Rodarte [phone]([id number]) on 04/11/2016 5:04:37 PM       ____________________________________________  RADIOLOGY  V/Q scan ordered but patient left AMA prior to scan.  ____________________________________________   PROCEDURES  Procedure(s) performed:   Procedures  None ____________________________________________   INITIAL IMPRESSION / ASSESSMENT AND PLAN / ED COURSE  Pertinent labs & imaging results that were available during my care of the patient were reviewed by me and considered in my medical decision making (see chart for details).  [redacted] week pregnant female presents to the emergency department with chest discomfort that is nonradiating and associated exertional dyspnea. She's had some  intermittent left leg swelling but none currently. Given her pregnancy I have increased suspicion for pulmonary embolism in this case she has associated sinus tachycardia but no hypoxemia. Blood pressure is normal. Discussed with her the risks and benefits of radiation exposure and undergoing this testing. She understands but also notes that she needs to leave to pick up her children soon. Plan for VQ scan. Called radiology to expedite study given her short timeframe. Will obtain I STAT troponin. EKG only remarkable for sinus tachycardia.   05:50 PM Patient is not willing to stay for the V/Q scan or any additional testing. V/Q scan technician was willing to rush her to the front of the line and obtain scan immediately. We had a Judaea Burgoon discussion regarding my concern for possible blood clot in the lungs especially given her increased risk in pregnancy. She understands that this is a potentially debilitating or even life-threatening diagnosis. She accepts that missing this may cause great harm to both her and the baby. I told her that I understand she must take care of her existing children had an she must leave  to pick them up but encouraged her to return to the emergency department at her earliest convenience for additional evaluation. She verbalizes understanding of the risks of leaving and agrees to sign out AGAINST MEDICAL ADVICE.    ____________________________________________  FINAL CLINICAL IMPRESSION(S) / ED DIAGNOSES  Final diagnoses:  Chest pain  Dyspnea on exertion     MEDICATIONS GIVEN DURING THIS VISIT:  None  NEW OUTPATIENT MEDICATIONS STARTED DURING THIS VISIT:  None   Note:  This document was prepared using Dragon voice recognition software and may include unintentional dictation errors.  Alona Bene, MD Emergency Medicine   Maia Plan, MD 04/12/16 [id number]

## 2016-04-11 NOTE — ED Triage Notes (Signed)
Pt c/o mid cp onset today, denies SOB, reports n/v, pt states, "The nausea and vomiting is normal because I am [redacted] weeks pregnant." pt reports recent cough with white phlegm, pt A&O x4, denies cardiac history, pt reports L sided leg swelling last night that has resolved today

## 2016-04-11 NOTE — ED Notes (Signed)
ED Provider at bedside. 

## 2016-04-13 ENCOUNTER — Emergency Department (HOSPITAL_COMMUNITY)
Admission: EM | Admit: 2016-04-13 | Discharge: 2016-04-13 | Disposition: A | Discharge status: Home / Self Care | Payer: BLUE CROSS/BLUE SHIELD | Attending: Emergency Medicine | Admitting: Emergency Medicine

## 2016-04-13 ENCOUNTER — Encounter (HOSPITAL_COMMUNITY): Payer: Self-pay | Admitting: Emergency Medicine

## 2016-04-13 ENCOUNTER — Emergency Department (HOSPITAL_BASED_OUTPATIENT_CLINIC_OR_DEPARTMENT_OTHER)
Admit: 2016-04-13 | Discharge: 2016-04-13 | Disposition: A | Discharge status: Home / Self Care | Payer: BLUE CROSS/BLUE SHIELD

## 2016-04-13 DIAGNOSIS — R609 Edema, unspecified: Secondary | ICD-10-CM | POA: Diagnosis not present

## 2016-04-13 DIAGNOSIS — Z79899 Other long term (current) drug therapy: Secondary | ICD-10-CM | POA: Insufficient documentation

## 2016-04-13 DIAGNOSIS — M79605 Pain in left leg: Secondary | ICD-10-CM | POA: Insufficient documentation

## 2016-04-13 DIAGNOSIS — F17210 Nicotine dependence, cigarettes, uncomplicated: Secondary | ICD-10-CM | POA: Insufficient documentation

## 2016-04-13 DIAGNOSIS — J45909 Unspecified asthma, uncomplicated: Secondary | ICD-10-CM | POA: Diagnosis not present

## 2016-04-13 DIAGNOSIS — R0789 Other chest pain: Secondary | ICD-10-CM | POA: Diagnosis present

## 2016-04-13 LAB — CBC
HCT: 28.8 % — ABNORMAL LOW (ref 36.0–46.0)
HEMOGLOBIN: 9.3 g/dL — AB (ref 12.0–15.0)
MCH: 25.8 pg — AB (ref 26.0–34.0)
MCHC: 32.3 g/dL (ref 30.0–36.0)
MCV: 79.8 fL (ref 78.0–100.0)
Platelets: 128 10*3/uL — ABNORMAL LOW (ref 150–400)
RBC: 3.61 MIL/uL — ABNORMAL LOW (ref 3.87–5.11)
RDW: 15.4 % (ref 11.5–15.5)
WBC: 9.7 10*3/uL (ref 4.0–10.5)

## 2016-04-13 LAB — BASIC METABOLIC PANEL
ANION GAP: 6 (ref 5–15)
BUN: 8 mg/dL (ref 6–20)
CALCIUM: 9.1 mg/dL (ref 8.9–10.3)
CO2: 23 mmol/L (ref 22–32)
Chloride: 107 mmol/L (ref 101–111)
Creatinine, Ser: 0.62 mg/dL (ref 0.44–1.00)
Glucose, Bld: 110 mg/dL — ABNORMAL HIGH (ref 65–99)
Potassium: 3.6 mmol/L (ref 3.5–5.1)
Sodium: 136 mmol/L (ref 135–145)

## 2016-04-13 LAB — I-STAT TROPONIN, ED: TROPONIN I, POC: 0 ng/mL (ref 0.00–0.08)

## 2016-04-13 LAB — D-DIMER, QUANTITATIVE (NOT AT ARMC): D DIMER QUANT: 0.47 ug{FEU}/mL (ref 0.00–0.50)

## 2016-04-13 NOTE — ED Triage Notes (Addendum)
Pt c/o non radiating center chest tightness with nausea onset few days ago. Pt seen here for same but had to leave before completing treatment. Pt is currently [redacted] weeks pregnant.

## 2016-04-13 NOTE — ED Notes (Signed)
Pt understood dc material. NAD noted. 

## 2016-04-13 NOTE — Progress Notes (Signed)
VASCULAR LAB PRELIMINARY  PRELIMINARY  PRELIMINARY  PRELIMINARY  Left lower extremity venous duplex completed.    Preliminary report:  There is no DVT or SVT noted in the left lower extremity.  Gave report to Dr. Alla Germanees  Sharon Stone, [facility] Center[facility]ANDACE, RVT 04/13/2016, 7:33 PM

## 2016-04-13 NOTE — ED Provider Notes (Signed)
[facility]-EMERGENCY DEPT Provider Note   CSN: [id number] Arrival date & time: 04/13/16  1551     History   Chief Complaint Chief Complaint  Patient presents with  . Chest Pain  . Nausea    HPI Sharon Stone is a 26 y.o. female.  The history is provided by the patient. No language interpreter was used.  Chest Pain     Sharon Stone is a 26 y.o. female who presents to the Emergency Department complaining of chest pain.  She has a history of asthma and is [redacted] weeks pregnant. She states that she's had chest tightness and shortness of breath over the last 2 days. Her chest tightness and soreness but have not responded to her usual albuterol treatments. She was seen in the emergency department 2 days ago but left without getting imaging performed because she had to leave. She has persistent chest tightness and presents for reevaluation today. She also endorses left leg pain and swelling that has been intermittent over the last month. Pain is located predominately in the ankle and lower leg. She has no history of blood clots. She has had no problems with this pregnancy.  [facility]. Past Medical History:  Diagnosis Date  . Asthma     Patient Active Problem List   Diagnosis Date Noted  . Post term pregnancy, 41 weeks 12/02/2014  . Evaluate anatomy not seen on prior sonogram   . Obesity   . [redacted] weeks gestation of pregnancy   . Tobacco smoking affecting pregnancy   . Encounter for fetal anatomic survey   . [redacted] weeks gestation of pregnancy   . Maternal morbid obesity, antepartum (HCC)   . Encounter for (NT) nuchal translucency scan   . [redacted] weeks gestation of pregnancy     Past Surgical History:  Procedure Laterality Date  . CESAREAN SECTION N/A 12/02/2014   Procedure: CESAREAN SECTION;  Surgeon: Tilda Burrow, MD;  Location: [facility];  Service: Obstetrics;  Laterality: N/A;    OB History    Gravida Para Term Preterm AB Living   3 2 2     2      SAB TAB Ectopic Multiple Live Births         0 2       Home Medications    Prior to Admission medications   Medication Sig Start Date End Date Taking? Authorizing Provider  albuterol (PROVENTIL HFA;VENTOLIN HFA) 108 (90 Base) MCG/ACT inhaler Inhale into the lungs every 6 (six) hours as needed for wheezing or shortness of breath.   Yes Historical Provider, MD  azithromycin (ZITHROMAX) 250 MG tablet Take 1 tablet (250 mg total) by mouth daily. Take first 2 tablets together, then 1 every day until finished. Patient not taking: Reported on 04/13/2016 01/25/16   Janne Napoleon, NP  budesonide-formoterol [facility]) 80-4.5 MCG/ACT inhaler Inhale 2 puffs into the lungs daily. Patient not taking: Reported on 04/13/2016 01/25/16   Janne Napoleon, NP  EPINEPHrine (EPIPEN 2-PAK) 0.3 mg/0.3 mL IJ SOAJ injection Inject 0.3 mg into the muscle once.    Historical Provider, MD  predniSONE (DELTASONE) 10 MG tablet Take 2 tablets (20 mg total) by mouth 2 (two) times daily with a meal. Patient not taking: Reported on 04/13/2016 01/25/16   Janne Napoleon, NP  promethazine (PHENERGAN) 12.5 MG tablet Take 1 tablet (12.5 mg total) by mouth every 6 (six) hours as needed for nausea or vomiting. Patient not taking: Reported on 04/13/2016  11/06/15   Judeth Horn, NP    Family History Family History  Problem Relation Age of Onset  . Cancer Mother   . Diabetes Maternal Aunt   . Heart disease Maternal Grandmother   . Diabetes Maternal Grandmother   . Cancer Maternal Grandmother     Social History Social History  Substance Use Topics  . Smoking status: Current Every Day Smoker    Packs/day: 0.10    Types: Cigarettes  . Smokeless tobacco: Never Used  . Alcohol use Yes     Allergies   Shellfish allergy   Review of Systems Review of Systems  Cardiovascular: Positive for chest pain.  All other systems reviewed and are negative.    Physical Exam Updated Vital Signs BP 133/70 (BP Location: Right Arm)    Pulse 85   Temp 97.9 F (36.6 C) (Oral)   Resp 18   Ht 5\' 5"  (1.651 m)   Wt 273 lb (123.8 kg)   LMP 09/22/2015 (Exact Date)   SpO2 98%   BMI 45.43 kg/m   Physical Exam  Constitutional: She is oriented to person, place, and time. She appears well-developed and well-nourished.  HENT:  Head: Normocephalic and atraumatic.  Cardiovascular: Normal rate and regular rhythm.   No murmur heard. Pulmonary/Chest: Effort normal and breath sounds normal. No respiratory distress.  Abdominal: Soft. There is no tenderness. There is no rebound and no guarding.  Obese abdomen  Musculoskeletal: She exhibits no edema or tenderness.  2+ DP pulses to bilateral arteries. Nonpitting edema to bilateral lower extremities.  No calf tenderness bilaterally.  Neurological: She is alert and oriented to person, place, and time.  Skin: Skin is warm and dry.  Psychiatric: She has a normal mood and affect. Her behavior is normal.  Nursing note and vitals reviewed.    ED Treatments / Results  Labs (all labs ordered are listed, but only abnormal results are displayed) Labs Reviewed  BASIC METABOLIC PANEL - Abnormal; Notable for the following:       Result Value   Glucose, Bld 110 (*)    All other components within normal limits  CBC - Abnormal; Notable for the following:    RBC 3.61 (*)    Hemoglobin 9.3 (*)    HCT 28.8 (*)    MCH 25.8 (*)    Platelets 128 (*)    All other components within normal limits  D-DIMER, QUANTITATIVE (NOT AT [facility]  I-STAT TROPOININ, ED    EKG  EKG Interpretation  Date/Time:  Sunday April 13 2016 16:06:17 EDT Ventricular Rate:  90 PR Interval:  134 QRS Duration: 82 QT Interval:  364 QTC Calculation: 445 R Axis:   64 Text Interpretation:  Sinus rhythm with Premature atrial complexes with Abberant conduction Otherwise normal ECG Confirmed by Lincoln Brigham [phone]) on 04/13/2016 6:10:27 PM       Radiology No results found.  Procedures Procedures (including critical care  time)  Medications Ordered in ED Medications - No data to display   Initial Impression / Assessment and Plan / ED Course  I have reviewed the triage vital signs and the nursing notes.  Pertinent labs & imaging results that were available during my care of the patient were reviewed by me and considered in my medical decision making (see chart for details).     Patient is a [redacted] weeks pregnant here with chest tightness and leg swelling and pain. She is in no distress in the emergency department with clear lungs on examination. In  terms of leg swelling, there is minimal swelling on examination with no evidence of acute infectious process. D-dimer is negative. Vascular ultrasound is negative for DVT. Current clinical picture is not consistent with PE, pneumonia, ACS, dissection. Counseled pt on home care for leg pain and chest tightness. Recommend Tylenol for pain, Tums for reflux. Discussed OBGYN follow-up and return precautions.   On dictation of ED notes patient's discharge blood pressure was elevated with no elevation during her ED stay. M.D. unaware of borderline elevated blood pressure at time of discharge. Patient did not have worrisome signs and findings for preeclampsia or hypertensive urgency.  Attempted to contact patient to discuss blood pressure on 3/19 at 246pm with no answer.   Final Clinical Impressions(s) / ED Diagnoses   Final diagnoses:  Chest tightness  Pain of left lower extremity    New Prescriptions Discharge Medication List as of 04/13/2016  8:16 PM       Tilden FossaElizabeth Janard Culp, MD 04/14/16 2332

## 2016-04-15 ENCOUNTER — Encounter (HOSPITAL_COMMUNITY): Payer: Self-pay | Admitting: *Deleted

## 2016-04-15 ENCOUNTER — Inpatient Hospital Stay (HOSPITAL_COMMUNITY)
Admission: AD | Admit: 2016-04-15 | Discharge: 2016-04-15 | Disposition: A | Discharge status: Home / Self Care | Payer: BLUE CROSS/BLUE SHIELD | Source: Ambulatory Visit | Attending: Family Medicine | Admitting: Family Medicine

## 2016-04-15 DIAGNOSIS — O9989 Other specified diseases and conditions complicating pregnancy, childbirth and the puerperium: Secondary | ICD-10-CM | POA: Diagnosis not present

## 2016-04-15 DIAGNOSIS — Z3A29 29 weeks gestation of pregnancy: Secondary | ICD-10-CM | POA: Insufficient documentation

## 2016-04-15 DIAGNOSIS — O9A213 Injury, poisoning and certain other consequences of external causes complicating pregnancy, third trimester: Secondary | ICD-10-CM

## 2016-04-15 DIAGNOSIS — W19XXXA Unspecified fall, initial encounter: Secondary | ICD-10-CM | POA: Diagnosis not present

## 2016-04-15 DIAGNOSIS — F17210 Nicotine dependence, cigarettes, uncomplicated: Secondary | ICD-10-CM | POA: Insufficient documentation

## 2016-04-15 DIAGNOSIS — O99333 Smoking (tobacco) complicating pregnancy, third trimester: Secondary | ICD-10-CM | POA: Insufficient documentation

## 2016-04-15 DIAGNOSIS — W010XXA Fall on same level from slipping, tripping and stumbling without subsequent striking against object, initial encounter: Secondary | ICD-10-CM | POA: Diagnosis not present

## 2016-04-15 DIAGNOSIS — O26893 Other specified pregnancy related conditions, third trimester: Secondary | ICD-10-CM | POA: Diagnosis not present

## 2016-04-15 LAB — URINALYSIS, ROUTINE W REFLEX MICROSCOPIC
Bilirubin Urine: NEGATIVE
Glucose, UA: NEGATIVE mg/dL
Hgb urine dipstick: NEGATIVE
Ketones, ur: NEGATIVE mg/dL
NITRITE: NEGATIVE
PH: 7 (ref 5.0–8.0)
Protein, ur: NEGATIVE mg/dL
SPECIFIC GRAVITY, URINE: 1.015 (ref 1.005–1.030)

## 2016-04-15 MED ORDER — ONDANSETRON 4 MG PO TBDP: 4.0000 mg | ORAL_TABLET | Freq: Four times a day (QID) | ORAL | 0 refills | Status: DC | PRN

## 2016-04-15 NOTE — Discharge Instructions (Signed)
Preventing Injuries During Pregnancy °Trauma is the most common cause of injury and death in pregnant women. This can also result in serious harm to the baby or even death. °How can injuries affect my pregnancy? °Your baby is protected in the womb (uterus) by a sac filled with fluid (amniotic sac). Your baby can be harmed if there is a direct blow to your abdomen and pelvis. Trauma may be caused by: °· Falls. These are more common in the second and third trimester of pregnancy. °· Automobile accidents. °· Domestic violence or assault. °· Severe burns, such as from fire or electricity. ° °These injuries can result in: °· Tearing of your uterus. °· The placenta pulling away from the wall of the uterus (placental abruption). °· The amniotic sac breaking open (rupture of membranes). °· Blockage or decrease in the blood supply to your baby. °· Going into labor earlier than expected. °· Severe injuries to other parts of your body, such as your brain, spine, heart, lungs, or other organs. ° °Minor falls and low-impact automobile accidents do not usually harm your baby, even if they cause a little harm to you. °What can I do to lower my risk? °Safety °· Remove slippery rugs and loose objects on the floor. They increase your risk of tripping or slipping. °· Wear comfortable shoes that have a good grip on the sole. Do not wear high-heeled shoes. °· Always wear your seat belt properly when riding in a car. Use both the lap and shoulder belt, with the lap belt below your abdomen. Always practice safe driving. Do not ride on a motorcycle while pregnant. °Activity °· Avoid walking on wet or slippery floors. °· Do not participate in rough and violent activities or sports. °· Avoid high-risk situations and activities such as: °? Lifting heavy pots of boiling or hot liquids. °? Fixing electrical problems. °? Being near fires or starting fires. °General instructions °· Take over-the-counter and prescription medicines only as told by  your health care provider. °· Know your blood type and the father's blood type in case you develop vaginal bleeding or experience an injury for which a blood transfusion is needed. °· Spousal abuse can be a serious cause of trauma during pregnancy. If you are a victim of domestic violence or assault: °? Call your local emergency services (911 in the [place] °? Contact the National Domestic Violence Hotline for help and support. °When should I seek immediate medical care? °Get help right away if: °· You fall on your abdomen or experience any serious blow to your abdomen. °· You develop stiffness in your neck or pain after a fall or from other trauma. °· You develop a headache or vision problems after a fall or from other trauma. °· You do not feel the baby moving after a fall or trauma, or you feel that the baby is not moving as much as before the fall or trauma. °· You have been the victim of domestic violence or any other kind of physical attack. °· You have been in a car accident. °· You develop vaginal bleeding. °· You have fluid leaking from the vagina. °· You develop uterine contractions. Symptoms include pelvic cramping, pain, or serious low back pain. °· You become weak, faint, or have uncontrolled vomiting after trauma. °· You have a serious burn. This includes burns to the face, neck, hands, or genitals, or burns greater than the size of your palm anywhere else. ° °Summary °· Trauma is the most common cause of   injury and death in pregnant women and can also lead to injury or death of the baby. °· Falls, automobile accidents, domestic violence or assault, and severe burns can injure you or your baby. Make sure to get medical help right away if you experience any of these during your pregnancy. °· Take steps to prevent slips or falls in your home, such as avoiding slippery floors and removing loose rugs. °· Always wear your seat belt properly when riding in a car. Practice safe driving. °This information is  not intended to replace advice given to you by your health care provider. Make sure you discuss any questions you have with your health care provider. °Document Released: 02/21/2004 Document Revised: 01/23/2016 Document Reviewed: 01/23/2016 °Elsevier Interactive Patient Education © 2017 [employer] ° °

## 2016-04-15 NOTE — MAU Note (Signed)
c/o falling today around 1100 today and hit her abdomen;

## 2016-04-15 NOTE — MAU Provider Note (Signed)
Chief Complaint:  Fall   First Provider Initiated Contact with Patient 04/15/16 1445      HPI: Sharon Stone is a 26 y.o. G3P2002 at 7468w3d who presents to maternity admissions reporting she was going up the stairs and turned quickly because she forgot something downstairs, then tripped and fell forward catching herself but hitting her abdomen on the stairs. She reported decreased fetal movement immediately after the fall but is now feeling normal movement.  She denies any other known injuries or pain.  She denies abdominal pain or cramping/contractions.  There are no associated symptoms. She has not tried any treatments. She reports good fetal movement, denies LOF, vaginal bleeding, vaginal itching/burning, urinary symptoms, h/a, dizziness, n/v, or fever/chills.    HPI  Past Medical History: Past Medical History:  Diagnosis Date  . Asthma     Past obstetric history: OB History  Gravida Para Term Preterm AB Living  3 2 2     2   SAB TAB Ectopic Multiple Live Births        0 2    # Outcome Date GA Lbr Len/2nd Weight Sex Delivery Anes PTL Lv  3 Current           2 Term 12/02/14 2255w0d  7 lb 5.3 oz (3.325 kg) M CS-Vac EPI  LIV  1 Term      Vag-Spont   LIV      Past Surgical History: Past Surgical History:  Procedure Laterality Date  . CESAREAN SECTION N/A 12/02/2014   Procedure: CESAREAN SECTION;  Surgeon: Tilda BurrowJohn Ferguson V, MD;  Location: [facility];  Service: Obstetrics;  Laterality: N/A;    Family History: Family History  Problem Relation Age of Onset  . Cancer Mother   . Diabetes Maternal Aunt   . Heart disease Maternal Grandmother   . Diabetes Maternal Grandmother   . Cancer Maternal Grandmother     Social History: Social History  Substance Use Topics  . Smoking status: Current Every Day Smoker    Packs/day: 0.25    Types: Cigarettes  . Smokeless tobacco: Current User  . Alcohol use Yes    Allergies:  Allergies  Allergen Reactions  . Shellfish Allergy  Anaphylaxis    Meds:  Prescriptions Prior to Admission  Medication Sig Dispense Refill Last Dose  . albuterol (PROVENTIL HFA;VENTOLIN HFA) 108 (90 Base) MCG/ACT inhaler Inhale into the lungs every 6 (six) hours as needed for wheezing or shortness of breath.   Past Week at Unknown time  . azithromycin (ZITHROMAX) 250 MG tablet Take 1 tablet (250 mg total) by mouth daily. Take first 2 tablets together, then 1 every day until finished. (Patient not taking: Reported on 04/13/2016) 6 tablet 0 Not Taking at Unknown time  . budesonide-formoterol (SYMBICORT) 80-4.5 MCG/ACT inhaler Inhale 2 puffs into the lungs daily. (Patient not taking: Reported on 04/13/2016) 1 Inhaler 1 Completed Course at Unknown time  . EPINEPHrine (EPIPEN 2-PAK) 0.3 mg/0.3 mL IJ SOAJ injection Inject 0.3 mg into the muscle once.   Not Taking at Unknown time  . predniSONE (DELTASONE) 10 MG tablet Take 2 tablets (20 mg total) by mouth 2 (two) times daily with a meal. (Patient not taking: Reported on 04/13/2016) 16 tablet 0 Completed Course at Unknown time  . promethazine (PHENERGAN) 12.5 MG tablet Take 1 tablet (12.5 mg total) by mouth every 6 (six) hours as needed for nausea or vomiting. (Patient not taking: Reported on 04/13/2016) 30 tablet 0 Completed Course at Unknown time  ROS:  Review of Systems  Constitutional: Negative for chills, fatigue and fever.  Eyes: Negative for visual disturbance.  Respiratory: Negative for shortness of breath.   Cardiovascular: Negative for chest pain.  Gastrointestinal: Negative for abdominal pain, nausea and vomiting.  Genitourinary: Negative for difficulty urinating, dysuria, flank pain, pelvic pain, vaginal bleeding, vaginal discharge and vaginal pain.  Neurological: Negative for dizziness and headaches.  Psychiatric/Behavioral: Negative.      I have reviewed patient's Past Medical Hx, Surgical Hx, Family Hx, Social Hx, medications and allergies.   Physical Exam  Patient Vitals for the  past 24 hrs:  BP Temp Temp src Pulse Resp  04/15/16 1746 127/75 - - 93 16  04/15/16 1424 131/73 98.3 F (36.8 C) Oral 99 16   Constitutional: Well-developed, well-nourished female in no acute distress.  Cardiovascular: normal rate Respiratory: normal effort GI: Abd soft, non-tender, gravid appropriate for gestational age.  MS: Extremities nontender, no edema, normal ROM Neurologic: Alert and oriented x 4.  GU: Neg CVAT.  PELVIC EXAM: Cervix pink, visually closed, without lesion, scant white creamy discharge, vaginal walls and external genitalia normal Bimanual exam: Cervix 0/long/high, firm, anterior, neg CMT, uterus nontender, nonenlarged, adnexa without tenderness, enlargement, or mass     FHT:  Baseline 130 , moderate variability, accelerations present, no decelerations Contractions: None on toco or to palpation   Labs: Results for orders placed or performed during the hospital encounter of 04/15/16 (from the past 24 hour(s))  Urinalysis, Routine w reflex microscopic     Status: Abnormal   Collection Time: 04/15/16  2:11 PM  Result Value Ref Range   Color, Urine YELLOW YELLOW   APPearance HAZY (A) CLEAR   Specific Gravity, Urine 1.015 1.005 - 1.030   pH 7.0 5.0 - 8.0   Glucose, UA NEGATIVE NEGATIVE mg/dL   Hgb urine dipstick NEGATIVE NEGATIVE   Bilirubin Urine NEGATIVE NEGATIVE   Ketones, ur NEGATIVE NEGATIVE mg/dL   Protein, ur NEGATIVE NEGATIVE mg/dL   Nitrite NEGATIVE NEGATIVE   Leukocytes, UA TRACE (A) NEGATIVE   RBC / HPF 0-5 0 - 5 RBC/hpf   WBC, UA 0-5 0 - 5 WBC/hpf   Bacteria, UA RARE (A) NONE SEEN   Squamous Epithelial / LPF 6-30 (A) NONE SEEN   Mucous PRESENT       Imaging:  No results found.  MAU Course/MDM: I have ordered labs and reviewed results.  NST reviewed Consult Stinson with presentation, exam findings and test results.  Pt without pain or contractions and feeling good movement. Discharge home with precautions/reasons to return. Pt to f/u at  [facility], needs to call to schedule next appointment. Pt vomited glucose load at her last visit, needs to reschedule glucose test. Rx for Zofran 4 mg ODT to take prior to test.  Pt stable at time of discharge.   Assessment: 1. Falls, initial encounter   2. Traumatic injury during pregnancy in third trimester     Plan: Discharge home Labor precautions and fetal kick counts  Follow-up Information    [facility] Follow up.   Why:  Pt to call to make her next appointment for glucose test.  Return to MAU as needed. Contact information: [address] [place] [address] [phone]          Allergies as of 04/15/2016      Reactions   Shellfish Allergy Anaphylaxis      Medication List    STOP taking these medications   azithromycin 250 MG  tablet Commonly known as:  ZITHROMAX   budesonide-formoterol 80-4.5 MCG/ACT inhaler Commonly known as:  SYMBICORT   predniSONE 10 MG tablet Commonly known as:  DELTASONE   promethazine 12.5 MG tablet Commonly known as:  PHENERGAN     TAKE these medications   albuterol 108 (90 Base) MCG/ACT inhaler Commonly known as:  PROVENTIL HFA;VENTOLIN HFA Inhale into the lungs every 6 (six) hours as needed for wheezing or shortness of breath.   EPIPEN 2-PAK 0.3 mg/0.3 mL Soaj injection Generic drug:  EPINEPHrine Inject 0.3 mg into the muscle once.   ondansetron 4 MG disintegrating tablet Commonly known as:  ZOFRAN ODT Take 1 tablet (4 mg total) by mouth every 6 (six) hours as needed for nausea.       Sharen Counter Certified Nurse-Midwife 04/15/2016 5:54 PM

## 2016-05-05 ENCOUNTER — Ambulatory Visit (HOSPITAL_COMMUNITY): Payer: BLUE CROSS/BLUE SHIELD

## 2016-05-05 ENCOUNTER — Ambulatory Visit (HOSPITAL_COMMUNITY)
Admission: EM | Admit: 2016-05-05 | Discharge: 2016-05-05 | Disposition: A | Discharge status: Home / Self Care | Payer: BLUE CROSS/BLUE SHIELD | Attending: Internal Medicine | Admitting: Internal Medicine

## 2016-05-05 ENCOUNTER — Encounter (HOSPITAL_COMMUNITY): Payer: Self-pay | Admitting: Emergency Medicine

## 2016-05-05 DIAGNOSIS — Z3A34 34 weeks gestation of pregnancy: Secondary | ICD-10-CM | POA: Diagnosis not present

## 2016-05-05 DIAGNOSIS — R0609 Other forms of dyspnea: Secondary | ICD-10-CM | POA: Diagnosis not present

## 2016-05-05 DIAGNOSIS — O99513 Diseases of the respiratory system complicating pregnancy, third trimester: Secondary | ICD-10-CM

## 2016-05-05 DIAGNOSIS — R0602 Shortness of breath: Secondary | ICD-10-CM

## 2016-05-05 NOTE — ED Provider Notes (Signed)
CSN: [id number]     Arrival date & time 05/05/16  1808 History   First MD Initiated Contact with Patient 05/05/16 1954     Chief Complaint  Patient presents with  . Shortness of Breath   (Consider location/radiation/quality/duration/timing/severity/associated sxs/prior Treatment) 26 year old morbidly obese female with a history of asthma and who does not have a PCP complaining of shortness of breath 3 days especially with exertion. She used her albuterol HFA once this morning. Denies fever, chest pain, abdominal pain.      Past Medical History:  Diagnosis Date  . Asthma    Past Surgical History:  Procedure Laterality Date  . CESAREAN SECTION N/A 12/02/2014   Procedure: CESAREAN SECTION;  Surgeon: Tilda Burrow, MD;  Location: [facility];  Service: Obstetrics;  Laterality: N/A;   Family History  Problem Relation Age of Onset  . Cancer Mother   . Diabetes Maternal Aunt   . Heart disease Maternal Grandmother   . Diabetes Maternal Grandmother   . Cancer Maternal Grandmother    Social History  Substance Use Topics  . Smoking status: Current Every Day Smoker    Packs/day: 0.25    Types: Cigarettes  . Smokeless tobacco: Current User  . Alcohol use Yes   OB History    Gravida Para Term Preterm AB Living   SAB TAB Ectopic Multiple Live Births         0 2     Review of Systems  Constitutional: Positive for activity change. Negative for chills and fever.  HENT: Negative.   Respiratory: Positive for shortness of breath. Negative for cough.   Gastrointestinal: Negative.   Musculoskeletal: Negative.   Neurological: Negative.     Allergies  Shellfish allergy  Home Medications   Prior to Admission medications   Medication Sig Start Date End Date Taking? Authorizing Provider  albuterol (PROVENTIL HFA;VENTOLIN HFA) 108 (90 Base) MCG/ACT inhaler Inhale into the lungs every 6 (six) hours as needed for wheezing or shortness of breath.   Yes Historical Provider, MD   EPINEPHrine (EPIPEN 2-PAK) 0.3 mg/0.3 mL IJ SOAJ injection Inject 0.3 mg into the muscle once.    Historical Provider, MD   Meds Ordered and Administered this Visit  Medications - No data to display  BP 124/66 (BP Location: Left Arm)   Pulse 95   Temp 99.3 F (37.4 C) (Oral)   Resp 16   LMP 09/22/2015 (Exact Date)   SpO2 100%  No data found.   Physical Exam  Constitutional: She appears well-developed and well-nourished. No distress.  Eyes: EOM are normal.  Neck: Neck supple.  Cardiovascular: Normal rate and regular rhythm.   Pulmonary/Chest: Effort normal and breath sounds normal. No respiratory distress. She has no wheezes. She has no rales.  Musculoskeletal: Normal range of motion.  Neurological: She is alert.  Skin: Skin is warm and dry.  Psychiatric: She has a normal mood and affect.  Nursing note and vitals reviewed.   Urgent Care Course     Procedures (including critical care time)  Labs Review Labs Reviewed - No data to display  Imaging Review No results found.   Visual Acuity Review  Right Eye Distance:   Left Eye Distance:   Bilateral Distance:    Right Eye Near:   Left Eye Near:    Bilateral Near:         MDM   1. DOE (dyspnea on exertion)   2. [redacted]  weeks gestation of pregnancy    Follow-up with your OB doctor this week. Do not overexert herself. At this time your lungs are clear, your oxygen concentration is 100% and you are not having shortness of breath at rest. It is possible that your uterus has grown to a point where he was unable to take as deep a breath when you are exerting yourself. At this time there does not appear to be a pathological reason. We were unable to do the x-ray because of your pregnancy. If he get worse, had new symptoms or problems go to [facility].     Hayden Rasmussen, NP 05/05/16 2034

## 2016-05-05 NOTE — ED Triage Notes (Signed)
The patient presented to the [facility] with a complaint of SOB and exacerbation of her asthma x 3 days.

## 2016-05-05 NOTE — Discharge Instructions (Signed)
Follow-up with your OB doctor this week. Do not overexert herself. At this time your lungs are clear, your oxygen concentration is 100% and you are not having shortness of breath at rest. It is possible that your uterus has grown to a point where he was unable to take as deep a breath when you are exerting yourself. At this time there does not appear to be a pathological reason. We were unable to do the x-ray because of your pregnancy. If he get worse, had new symptoms or problems go to [facility].

## 2016-05-07 ENCOUNTER — Encounter (HOSPITAL_COMMUNITY): Payer: Self-pay

## 2016-05-08 ENCOUNTER — Other Ambulatory Visit: Payer: Self-pay | Admitting: Obstetrics and Gynecology

## 2016-05-08 DIAGNOSIS — O361990 Maternal care for other isoimmunization, unspecified trimester, not applicable or unspecified: Secondary | ICD-10-CM

## 2016-05-08 MED ORDER — DEXTROSE 5 % IV SOLN: 2.0000 g | INTRAVENOUS | Status: DC

## 2016-05-08 MED ORDER — SOD CITRATE-CITRIC ACID 500-334 MG/5ML PO SOLN: 30.0000 mL | ORAL | Status: DC

## 2016-05-28 ENCOUNTER — Encounter (HOSPITAL_COMMUNITY): Payer: Self-pay

## 2016-05-28 ENCOUNTER — Inpatient Hospital Stay (HOSPITAL_COMMUNITY): Payer: BLUE CROSS/BLUE SHIELD

## 2016-05-28 ENCOUNTER — Inpatient Hospital Stay (HOSPITAL_COMMUNITY)
Admission: AD | Admit: 2016-05-28 | Discharge: 2016-05-30 | DRG: 775 | Disposition: A | Discharge status: Home / Self Care | Payer: BLUE CROSS/BLUE SHIELD | Source: Ambulatory Visit | Attending: Obstetrics and Gynecology | Admitting: Obstetrics and Gynecology

## 2016-05-28 DIAGNOSIS — O9952 Diseases of the respiratory system complicating childbirth: Secondary | ICD-10-CM | POA: Diagnosis present

## 2016-05-28 DIAGNOSIS — Z98891 History of uterine scar from previous surgery: Secondary | ICD-10-CM

## 2016-05-28 DIAGNOSIS — O99334 Smoking (tobacco) complicating childbirth: Secondary | ICD-10-CM | POA: Diagnosis present

## 2016-05-28 DIAGNOSIS — J45909 Unspecified asthma, uncomplicated: Secondary | ICD-10-CM | POA: Diagnosis present

## 2016-05-28 DIAGNOSIS — IMO0002 Reserved for concepts with insufficient information to code with codable children: Secondary | ICD-10-CM

## 2016-05-28 DIAGNOSIS — O99119 Other diseases of the blood and blood-forming organs and certain disorders involving the immune mechanism complicating pregnancy, unspecified trimester: Secondary | ICD-10-CM | POA: Diagnosis present

## 2016-05-28 DIAGNOSIS — F17210 Nicotine dependence, cigarettes, uncomplicated: Secondary | ICD-10-CM | POA: Diagnosis present

## 2016-05-28 DIAGNOSIS — O139 Gestational [pregnancy-induced] hypertension without significant proteinuria, unspecified trimester: Secondary | ICD-10-CM | POA: Clinically undetermined

## 2016-05-28 DIAGNOSIS — O365930 Maternal care for other known or suspected poor fetal growth, third trimester, not applicable or unspecified: Secondary | ICD-10-CM | POA: Diagnosis present

## 2016-05-28 DIAGNOSIS — Z3A35 35 weeks gestation of pregnancy: Secondary | ICD-10-CM

## 2016-05-28 DIAGNOSIS — F1210 Cannabis abuse, uncomplicated: Secondary | ICD-10-CM

## 2016-05-28 DIAGNOSIS — O364XX0 Maternal care for intrauterine death, not applicable or unspecified: Principal | ICD-10-CM | POA: Diagnosis present

## 2016-05-28 DIAGNOSIS — Z6841 Body Mass Index (BMI) 40.0 and over, adult: Secondary | ICD-10-CM | POA: Diagnosis not present

## 2016-05-28 DIAGNOSIS — O9912 Other diseases of the blood and blood-forming organs and certain disorders involving the immune mechanism complicating childbirth: Secondary | ICD-10-CM | POA: Diagnosis present

## 2016-05-28 DIAGNOSIS — D6959 Other secondary thrombocytopenia: Secondary | ICD-10-CM | POA: Diagnosis present

## 2016-05-28 DIAGNOSIS — O134 Gestational [pregnancy-induced] hypertension without significant proteinuria, complicating childbirth: Secondary | ICD-10-CM | POA: Diagnosis present

## 2016-05-28 DIAGNOSIS — O99214 Obesity complicating childbirth: Secondary | ICD-10-CM | POA: Diagnosis present

## 2016-05-28 DIAGNOSIS — O34211 Maternal care for low transverse scar from previous cesarean delivery: Secondary | ICD-10-CM | POA: Diagnosis present

## 2016-05-28 DIAGNOSIS — D696 Thrombocytopenia, unspecified: Secondary | ICD-10-CM | POA: Diagnosis present

## 2016-05-28 LAB — CBC
HCT: 30.6 % — ABNORMAL LOW (ref 36.0–46.0)
Hemoglobin: 9.9 g/dL — ABNORMAL LOW (ref 12.0–15.0)
MCH: 25.1 pg — AB (ref 26.0–34.0)
MCHC: 32.4 g/dL (ref 30.0–36.0)
MCV: 77.7 fL — ABNORMAL LOW (ref 78.0–100.0)
PLATELETS: 131 10*3/uL — AB (ref 150–400)
RBC: 3.94 MIL/uL (ref 3.87–5.11)
RDW: 16 % — ABNORMAL HIGH (ref 11.5–15.5)
WBC: 9.6 10*3/uL (ref 4.0–10.5)

## 2016-05-28 LAB — TYPE AND SCREEN
ABO/RH(D): O POS
Antibody Screen: NEGATIVE

## 2016-05-28 MED ORDER — BUTORPHANOL TARTRATE 1 MG/ML IJ SOLN
2.0000 mg | INTRAMUSCULAR | Status: DC | PRN
  Administered 2016-05-28 – 2016-05-29 (×2): 2 mg via INTRAVENOUS
  Filled 2016-05-28 (×2): qty 2

## 2016-05-28 MED ORDER — MISOPROSTOL 200 MCG PO TABS
100.0000 ug | ORAL_TABLET | ORAL | Status: DC
  Administered 2016-05-28 – 2016-05-29 (×3): 100 ug via ORAL
  Filled 2016-05-28 (×3): qty 2

## 2016-05-28 MED ORDER — OXYTOCIN BOLUS FROM INFUSION
500.0000 mL | Freq: Once | INTRAVENOUS | Status: AC
  Administered 2016-05-29: 500 mL via INTRAVENOUS

## 2016-05-28 MED ORDER — LACTATED RINGERS IV SOLN
INTRAVENOUS | Status: DC
  Administered 2016-05-28 – 2016-05-29 (×2): via INTRAVENOUS

## 2016-05-28 MED ORDER — OXYCODONE-ACETAMINOPHEN 5-325 MG PO TABS: 2.0000 | ORAL_TABLET | ORAL | Status: DC | PRN

## 2016-05-28 MED ORDER — ACETAMINOPHEN 325 MG PO TABS: 650.0000 mg | ORAL_TABLET | ORAL | Status: DC | PRN

## 2016-05-28 MED ORDER — OXYTOCIN 40 UNITS IN LACTATED RINGERS INFUSION - SIMPLE MED
2.5000 [IU]/h | INTRAVENOUS | Status: DC
  Filled 2016-05-28: qty 1000

## 2016-05-28 MED ORDER — LIDOCAINE HCL (PF) 1 % IJ SOLN
30.0000 mL | INTRAMUSCULAR | Status: DC | PRN
  Filled 2016-05-28: qty 30

## 2016-05-28 MED ORDER — LACTATED RINGERS IV SOLN: 500.0000 mL | INTRAVENOUS | Status: DC | PRN

## 2016-05-28 MED ORDER — MISOPROSTOL 200 MCG PO TABS
200.0000 ug | ORAL_TABLET | Freq: Once | ORAL | Status: AC
  Administered 2016-05-28: 200 ug via VAGINAL
  Filled 2016-05-28: qty 1

## 2016-05-28 MED ORDER — SOD CITRATE-CITRIC ACID 500-334 MG/5ML PO SOLN: 30.0000 mL | ORAL | Status: DC | PRN

## 2016-05-28 MED ORDER — ONDANSETRON HCL 4 MG/2ML IJ SOLN: 4.0000 mg | Freq: Four times a day (QID) | INTRAMUSCULAR | Status: DC | PRN

## 2016-05-28 MED ORDER — OXYCODONE-ACETAMINOPHEN 5-325 MG PO TABS: 1.0000 | ORAL_TABLET | ORAL | Status: DC | PRN

## 2016-05-28 NOTE — Progress Notes (Signed)
Dr Macon Large and Dr Genevie Ann at bedside to discuss IUFD with pt and plan of care.

## 2016-05-28 NOTE — H&P (Signed)
Sharon Stone is a 26 y.o. female presenting for [facility] to [facility].  States has not felt fetal movement for 2 days.  States has had no problems with pregnancy First delivery was vaginal  And second was done by Dr Alvester Morin as a Cesarean Delivery for fetal intolerance to labor  . OB History    Gravida Para Term Preterm AB Living   SAB TAB Ectopic Multiple Live Births         0 2     Past Medical History:  Diagnosis Date  . Asthma    Past Surgical History:  Procedure Laterality Date  . CESAREAN SECTION N/A 12/02/2014   Procedure: CESAREAN SECTION;  Surgeon: Tilda Burrow, MD;  Location: [facility];  Service: Obstetrics;  Laterality: N/A;   Family History: family history includes Cancer in her maternal grandmother and mother; Diabetes in her maternal aunt and maternal grandmother; Heart disease in her maternal grandmother. Social History:  reports that she has been smoking Cigarettes.  She has been smoking about 0.25 packs per day. She uses smokeless tobacco. She reports that she drinks alcohol. She reports that she uses drugs, including Marijuana.     Maternal Diabetes: No Genetic Screening: Normal Maternal Ultrasounds/Referrals: Normal Fetal Ultrasounds or other Referrals:  None Maternal Substance Abuse:  Yes:  Type: Marijuana and cigarettes Significant Maternal Medications:  None Significant Maternal Lab Results:  None Other Comments:  None  Review of Systems  Constitutional: Negative for chills, fever and malaise/fatigue.  Respiratory: Negative for shortness of breath.   Cardiovascular: Negative for leg swelling.  Gastrointestinal: Negative for abdominal pain, constipation, diarrhea, nausea and vomiting.  Genitourinary:       No fetal movement last 2 days  Neurological: Negative for focal weakness.   Maternal Medical History:  Reason for admission: Nausea. IUFD   Contractions: Onset was 3-5 hours ago.   Frequency:  rare.    Fetal activity: Perceived fetal activity is none.   Last perceived fetal movement was greater than 24 hours ago.    Prenatal complications: Substance abuse.   No bleeding, PIH, pre-eclampsia or preterm labor.   Prenatal Complications - Diabetes: none.      Blood pressure 130/77, pulse 82, temperature 99 F (37.2 C), temperature source Oral, resp. rate 18, height  (1.651 m), weight 292 lb (132.5 kg), last menstrual period 09/22/2015, SpO2 100 %, not currently breastfeeding. Maternal Exam:  Uterine Assessment: Contraction strength is mild.  Contraction frequency is rare.   Abdomen: Patient reports no abdominal tenderness. Surgical scars: low transverse.   Introitus: Ferning test: not done.  Nitrazine test: not done. Amniotic fluid character: not assessed.     Fetal Exam Fetal Monitor Review: Mode: ultrasound.   Baseline rate: 0.   Fetal State Assessment: Category III - tracings are abnormal.     Physical Exam  Constitutional: She is oriented to person, place, and time. She appears well-developed and well-nourished. No distress.  HENT:  Head: Normocephalic.  Cardiovascular: Normal rate and regular rhythm.   Respiratory: Effort normal. No respiratory distress.  GI: Soft. She exhibits no distension. There is no tenderness. There is no rebound and no guarding.  Genitourinary:  Genitourinary Comments: Deferred   Musculoskeletal: Normal range of motion.  Neurological: She is alert and oriented to person, place, and time.  Skin: Skin is warm and dry.  Psychiatric: She has a normal mood and affect.  Prenatal labs: ABO, Rh:   Antibody:   Rubella:   RPR:    HBsAg:    HIV: Non Reactive (10/10 1616)  GBS:     Assessment/Plan: Single IUP at [redacted]w[redacted]d Intrauterine Fetal Demise Prior Cesarean delivery preceded by a vaginal delivery Anhydramnios SGA  Admit to [facility] for induction of labor Dr Macon Large discussed diagnosis with patient and her  husband     Wynelle Bourgeois 05/28/2016, 4:09 PM

## 2016-05-28 NOTE — Progress Notes (Signed)
Chaplain responding to notification from daytime chaplain that pt has just arrived who will need spiritual and emotional support due to IUFD.  Chaplain arrived and introduced self to pt, significant other and 22 month old son who are at pt's bedside providing support and grieving appropriately.  Pt and spouse share feelings of disbelief.  Pt says she does not feel like "it" has hit her yet.  Chaplain normalized pt's and spouse's feelings.  Pt shares that she appreciates spiritual support and that she feels she will need it once she delivers and "it becomes more real."  Chaplain assured pt and spouse that chaplain services are available when they feel they need spiritual and emotional support.  Please page chaplain if and when pt desires further support.  Blain Pais 05/28/16 10:24 PM    05/28/16 1735  Clinical Encounter Type  Visited With Patient and family together;Health care provider  Visit Type Initial;Psychological support;Spiritual support  Referral From Chaplain  Spiritual Encounters  Spiritual Needs Emotional;Grief support  Stress Factors  Patient Stress Factors Family relationships;Loss  Family Stress Factors Loss

## 2016-05-28 NOTE — MAU Note (Signed)
Pt [facility] from [facility]. Pt c/o no fetal movement for the last two days. Pt states they were unable to find FHR at [facility] today. Pt denies bleeding, leaking of fluid, and contractions.

## 2016-05-28 NOTE — MAU Note (Signed)
Urine in lab 

## 2016-05-28 NOTE — Progress Notes (Signed)
Pt into room. RN attempted to find FHR with cardio x5 mins. RN unable to find. Beather Arbour into room with ultrasound machine. Bedside u/s by CNM. Unable to visualize FHR. RN called for stat bedside u/s.

## 2016-05-29 ENCOUNTER — Inpatient Hospital Stay (HOSPITAL_COMMUNITY): Payer: BLUE CROSS/BLUE SHIELD | Admitting: Anesthesiology

## 2016-05-29 ENCOUNTER — Encounter (HOSPITAL_COMMUNITY): Payer: Self-pay | Admitting: *Deleted

## 2016-05-29 DIAGNOSIS — O364XX0 Maternal care for intrauterine death, not applicable or unspecified: Secondary | ICD-10-CM

## 2016-05-29 DIAGNOSIS — O365930 Maternal care for other known or suspected poor fetal growth, third trimester, not applicable or unspecified: Secondary | ICD-10-CM

## 2016-05-29 DIAGNOSIS — Z3A35 35 weeks gestation of pregnancy: Secondary | ICD-10-CM

## 2016-05-29 LAB — PROTEIN / CREATININE RATIO, URINE
CREATININE, URINE: 134 mg/dL
Protein Creatinine Ratio: 0.1 mg/mg{Cre} (ref 0.00–0.15)
Total Protein, Urine: 14 mg/dL

## 2016-05-29 LAB — CBC
HCT: 30.5 % — ABNORMAL LOW (ref 36.0–46.0)
HCT: 29.4 % — ABNORMAL LOW (ref 36.0–46.0)
HEMOGLOBIN: 10.1 g/dL — AB (ref 12.0–15.0)
Hemoglobin: 9.7 g/dL — ABNORMAL LOW (ref 12.0–15.0)
MCH: 25.4 pg — AB (ref 26.0–34.0)
MCH: 25.5 pg — ABNORMAL LOW (ref 26.0–34.0)
MCHC: 33.1 g/dL (ref 30.0–36.0)
MCHC: 33 g/dL (ref 30.0–36.0)
MCV: 76.6 fL — ABNORMAL LOW (ref 78.0–100.0)
MCV: 77.2 fL — ABNORMAL LOW (ref 78.0–100.0)
PLATELETS: 126 10*3/uL — AB (ref 150–400)
PLATELETS: 128 10*3/uL — AB (ref 150–400)
RBC: 3.98 MIL/uL (ref 3.87–5.11)
RBC: 3.81 MIL/uL — ABNORMAL LOW (ref 3.87–5.11)
RDW: 16.2 % — ABNORMAL HIGH (ref 11.5–15.5)
RDW: 16.1 % — AB (ref 11.5–15.5)
WBC: 8.6 10*3/uL (ref 4.0–10.5)
WBC: 10.1 10*3/uL (ref 4.0–10.5)

## 2016-05-29 LAB — COMPREHENSIVE METABOLIC PANEL
ALK PHOS: 77 U/L (ref 38–126)
ALT: 13 U/L — AB (ref 14–54)
ANION GAP: 6 (ref 5–15)
AST: 15 U/L (ref 15–41)
Albumin: 2.8 g/dL — ABNORMAL LOW (ref 3.5–5.0)
BILIRUBIN TOTAL: 0.2 mg/dL — AB (ref 0.3–1.2)
BUN: 8 mg/dL (ref 6–20)
CALCIUM: 9 mg/dL (ref 8.9–10.3)
CO2: 23 mmol/L (ref 22–32)
CREATININE: 0.54 mg/dL (ref 0.44–1.00)
Chloride: 107 mmol/L (ref 101–111)
Glucose, Bld: 105 mg/dL — ABNORMAL HIGH (ref 65–99)
Potassium: 3.6 mmol/L (ref 3.5–5.1)
SODIUM: 136 mmol/L (ref 135–145)
TOTAL PROTEIN: 7.3 g/dL (ref 6.5–8.1)

## 2016-05-29 LAB — RPR: RPR: NONREACTIVE

## 2016-05-29 MED ORDER — ONDANSETRON HCL 4 MG PO TABS: 4.0000 mg | ORAL_TABLET | ORAL | Status: DC | PRN

## 2016-05-29 MED ORDER — SODIUM CHLORIDE 0.9 % IV SOLN: 250.0000 mL | INTRAVENOUS | Status: DC | PRN

## 2016-05-29 MED ORDER — EPHEDRINE 5 MG/ML INJ
10.0000 mg | INTRAVENOUS | Status: DC | PRN
  Filled 2016-05-29: qty 2

## 2016-05-29 MED ORDER — LACTATED RINGERS IV SOLN: 500.0000 mL | Freq: Once | INTRAVENOUS | Status: DC

## 2016-05-29 MED ORDER — SODIUM CHLORIDE 0.9% FLUSH
3.0000 mL | Freq: Two times a day (BID) | INTRAVENOUS | Status: DC
  Administered 2016-05-29: 3 mL via INTRAVENOUS

## 2016-05-29 MED ORDER — LIDOCAINE HCL (PF) 1 % IJ SOLN
INTRAMUSCULAR | Status: DC | PRN
  Administered 2016-05-29: 4 mL via EPIDURAL

## 2016-05-29 MED ORDER — IBUPROFEN 600 MG PO TABS
600.0000 mg | ORAL_TABLET | Freq: Four times a day (QID) | ORAL | Status: DC
  Administered 2016-05-29 – 2016-05-30 (×5): 600 mg via ORAL
  Filled 2016-05-29 (×5): qty 1

## 2016-05-29 MED ORDER — SENNOSIDES-DOCUSATE SODIUM 8.6-50 MG PO TABS
2.0000 | ORAL_TABLET | ORAL | Status: DC
  Administered 2016-05-29: 2 via ORAL
  Filled 2016-05-29: qty 2

## 2016-05-29 MED ORDER — BENZOCAINE-MENTHOL 20-0.5 % EX AERO: 1.0000 "application " | INHALATION_SPRAY | CUTANEOUS | Status: DC | PRN

## 2016-05-29 MED ORDER — SERTRALINE HCL 25 MG PO TABS
25.0000 mg | ORAL_TABLET | Freq: Every day | ORAL | Status: DC
  Administered 2016-05-29 – 2016-05-30 (×2): 25 mg via ORAL
  Filled 2016-05-29 (×3): qty 1

## 2016-05-29 MED ORDER — HYDRALAZINE HCL 20 MG/ML IJ SOLN: 10.0000 mg | Freq: Once | INTRAMUSCULAR | Status: DC | PRN

## 2016-05-29 MED ORDER — PHENYLEPHRINE 40 MCG/ML (10ML) SYRINGE FOR IV PUSH (FOR BLOOD PRESSURE SUPPORT)
80.0000 ug | PREFILLED_SYRINGE | INTRAVENOUS | Status: DC | PRN
  Filled 2016-05-29: qty 5

## 2016-05-29 MED ORDER — WITCH HAZEL-GLYCERIN EX PADS: 1.0000 "application " | MEDICATED_PAD | CUTANEOUS | Status: DC | PRN

## 2016-05-29 MED ORDER — ACETAMINOPHEN 325 MG PO TABS: 650.0000 mg | ORAL_TABLET | ORAL | Status: DC | PRN

## 2016-05-29 MED ORDER — ZOLPIDEM TARTRATE 5 MG PO TABS: 5.0000 mg | ORAL_TABLET | Freq: Every evening | ORAL | Status: DC | PRN

## 2016-05-29 MED ORDER — SODIUM CHLORIDE 0.9% FLUSH: 3.0000 mL | INTRAVENOUS | Status: DC | PRN

## 2016-05-29 MED ORDER — LABETALOL HCL 5 MG/ML IV SOLN
20.0000 mg | INTRAVENOUS | Status: DC | PRN
  Administered 2016-05-29: 20 mg via INTRAVENOUS

## 2016-05-29 MED ORDER — PHENYLEPHRINE 40 MCG/ML (10ML) SYRINGE FOR IV PUSH (FOR BLOOD PRESSURE SUPPORT)
80.0000 ug | PREFILLED_SYRINGE | INTRAVENOUS | Status: DC | PRN
  Filled 2016-05-29: qty 5
  Filled 2016-05-29: qty 10

## 2016-05-29 MED ORDER — FENTANYL 2.5 MCG/ML BUPIVACAINE 1/10 % EPIDURAL INFUSION (WH - ANES)
14.0000 mL/h | INTRAMUSCULAR | Status: DC | PRN
  Administered 2016-05-29: 14 mL/h via EPIDURAL
  Filled 2016-05-29: qty 100

## 2016-05-29 MED ORDER — DIPHENHYDRAMINE HCL 25 MG PO CAPS: 25.0000 mg | ORAL_CAPSULE | Freq: Four times a day (QID) | ORAL | Status: DC | PRN

## 2016-05-29 MED ORDER — OXYTOCIN 40 UNITS IN LACTATED RINGERS INFUSION - SIMPLE MED: 2.5000 [IU]/h | INTRAVENOUS | Status: DC | PRN

## 2016-05-29 MED ORDER — LABETALOL HCL 5 MG/ML IV SOLN
INTRAVENOUS | Status: AC
  Filled 2016-05-29: qty 4

## 2016-05-29 MED ORDER — PRENATAL MULTIVITAMIN CH
1.0000 | ORAL_TABLET | Freq: Every day | ORAL | Status: DC
  Administered 2016-05-30: 1 via ORAL
  Filled 2016-05-29: qty 1

## 2016-05-29 MED ORDER — SIMETHICONE 80 MG PO CHEW: 80.0000 mg | CHEWABLE_TABLET | ORAL | Status: DC | PRN

## 2016-05-29 MED ORDER — OXYCODONE HCL 5 MG PO TABS: 5.0000 mg | ORAL_TABLET | ORAL | Status: DC | PRN

## 2016-05-29 MED ORDER — DIBUCAINE 1 % RE OINT: 1.0000 "application " | TOPICAL_OINTMENT | RECTAL | Status: DC | PRN

## 2016-05-29 MED ORDER — ONDANSETRON HCL 4 MG/2ML IJ SOLN: 4.0000 mg | INTRAMUSCULAR | Status: DC | PRN

## 2016-05-29 MED ORDER — OXYCODONE HCL 5 MG PO TABS
10.0000 mg | ORAL_TABLET | ORAL | Status: DC | PRN
  Administered 2016-05-29: 10 mg via ORAL
  Filled 2016-05-29: qty 2

## 2016-05-29 MED ORDER — COCONUT OIL OIL: 1.0000 "application " | TOPICAL_OIL | Status: DC | PRN

## 2016-05-29 MED ORDER — DIPHENHYDRAMINE HCL 50 MG/ML IJ SOLN: 12.5000 mg | INTRAMUSCULAR | Status: DC | PRN

## 2016-05-29 MED ORDER — TETANUS-DIPHTH-ACELL PERTUSSIS 5-2.5-18.5 LF-MCG/0.5 IM SUSP: 0.5000 mL | Freq: Once | INTRAMUSCULAR | Status: DC

## 2016-05-29 NOTE — Anesthesia Procedure Notes (Signed)
Epidural Patient location during procedure: OB Start time: 05/29/2016 6:38 AM End time: 05/29/2016 6:44 AM  Staffing Anesthesiologist: Shona SimpsonHOLLIS, KEVIN D Performed: anesthesiologist   Preanesthetic Checklist Completed: patient identified, site marked, surgical consent, pre-op evaluation, timeout performed, IV checked, risks and benefits discussed and monitors and equipment checked  Epidural Patient position: sitting Prep: ChloraPrep Patient monitoring: heart rate, continuous pulse ox and blood pressure Approach: midline Location: L3-L4 Injection technique: LOR saline  Needle:  Needle type: Tuohy  Needle gauge: 17 G Needle length: 9 cm Catheter type: closed end flexible Catheter size: 20 Guage Test dose: negative and 1.5% lidocaine  Assessment Events: blood not aspirated, injection not painful, no injection resistance and no paresthesia  Additional Notes LOR @7   Patient identified. Risks/Benefits/Options discussed with patient including but not limited to bleeding, infection, nerve damage, paralysis, failed block, incomplete pain control, headache, blood pressure changes, nausea, vomiting, reactions to medications, itching and postpartum back pain. Confirmed with bedside nurse the patient's most recent platelet count. Confirmed with patient that they are not currently taking any anticoagulation, have any bleeding history or any family history of bleeding disorders. Patient expressed understanding and wished to proceed. All questions were answered. Sterile technique was used throughout the entire procedure. Please see nursing notes for vital signs. Test dose was given through epidural catheter and negative prior to continuing to dose epidural or start infusion. Warning signs of high block given to the patient including shortness of breath, tingling/numbness in hands, complete motor block, or any concerning symptoms with instructions to call for help. Patient was given instructions on fall  risk and not to get out of bed. All questions and concerns addressed with instructions to call with any issues or inadequate analgesia.    Reason for block:procedure for pain

## 2016-05-29 NOTE — Anesthesia Postprocedure Evaluation (Signed)
Anesthesia Post Note  Patient: Sharon Stone  Procedure(s) Performed: * No procedures listed *  Patient location during evaluation: Women's Unit Anesthesia Type: Epidural Level of consciousness: awake and alert Pain management: pain level controlled Vital Signs Assessment: post-procedure vital signs reviewed and stable Respiratory status: spontaneous breathing, nonlabored ventilation and respiratory function stable Cardiovascular status: stable Postop Assessment: no headache, no backache and epidural receding Anesthetic complications: no        Last Vitals:  Vitals:   05/29/16 1301 05/29/16 1702  BP: 123/65 126/71  Pulse: 78 65  Resp: 20 18  Temp: 36.7 C 36.8 C    Last Pain:  Vitals:   05/29/16 1702  TempSrc: Oral  PainSc:    Pain Goal:                 Junious SilkGILBERT,Dawnell Bryant

## 2016-05-29 NOTE — Anesthesia Preprocedure Evaluation (Signed)
Anesthesia Evaluation  Patient identified by MRN, date of birth, ID band Patient awake    Reviewed: Allergy & Precautions, Patient's Chart, lab work & pertinent test results  Airway Mallampati: III       Dental  (+) Teeth Intact   Pulmonary asthma , Current Smoker,    breath sounds clear to auscultation       Cardiovascular negative cardio ROS   Rhythm:Regular Rate:Normal     Neuro/Psych negative neurological ROS  negative psych ROS   GI/Hepatic negative GI ROS, Neg liver ROS,   Endo/Other  negative endocrine ROS  Renal/GU negative Renal ROS  negative genitourinary   Musculoskeletal negative musculoskeletal ROS (+)   Abdominal   Peds negative pediatric ROS (+)  Hematology negative hematology ROS (+)   Anesthesia Other Findings   Reproductive/Obstetrics (+) Pregnancy                             Lab Results  Component Value Date   WBC 8.6 05/29/2016   HGB 10.1 (L) 05/29/2016   HCT 30.5 (L) 05/29/2016   MCV 76.6 (L) 05/29/2016   PLT 126 (L) 05/29/2016     Anesthesia Physical Anesthesia Plan  ASA: III  Anesthesia Plan: Epidural   Post-op Pain Management:    Induction:   Airway Management Planned:   Additional Equipment:   Intra-op Plan:   Post-operative Plan:   Informed Consent: I have reviewed the patients History and Physical, chart, labs and discussed the procedure including the risks, benefits and alternatives for the proposed anesthesia with the patient or authorized representative who has indicated his/her understanding and acceptance.     Plan Discussed with:   Anesthesia Plan Comments:         Anesthesia Quick Evaluation

## 2016-05-29 NOTE — Progress Notes (Signed)
Initial visit with Epimenio FootSantiya and her partner upon nurse referral after baby Cameron's delivery.  Epimenio FootSantiya was holding her son when I entered the room and grieving appropriately. She shared that the sadness comes in waves, but she is glad to learn that holding the baby is helpful.  She was afraid it would be too hard, but finds it comforting.   I visited with her throughout the day.  Encouraging her to choose an outfit for the baby and explaining some resources for grief.  We discussed the ways people may choose to make memories in the hospital.  She has been singing to him and taking photos.  The couple have two other children and we also discussed ways to share this news with them.  They are aware that they can hold Sheria LangCameron as long as they like.    The family is fairly certain they will cremate Sheria Langameron.  They hope to have his ashes at home.    Will continue to follow.  Please page as further needs arise.  Maryanna ShapeAmanda M. Carley Hammedavee Lomax, M.Div. [facility] Chaplain Pager [phone] Office [phone]     05/29/16 1212  Clinical Encounter Type  Visited With Patient and family together  Visit Type Follow-up;Spiritual support  Referral From Chaplain;Nurse  Spiritual Encounters  Spiritual Needs Emotional;Grief support  Stress Factors  Patient Stress Factors Loss  Family Stress Factors Loss

## 2016-05-29 NOTE — Lactation Note (Signed)
Lactation Consultation Note  Patient Name: Sharon Stone ZOXWR'UToday's Date: 05/29/2016   Fetal demise. Mom reports that her milk came to volume with 2 older children, and her breasts are dripping now. Reviewed "Lactation After Lose" handout and offered to get mom cabbage leaves from the cafeteria--but she declined. Mom given manual pump, and she states that she already knows how to use it. Mom also given breast pads. [facility] needed.   Maternal Data    Feeding    LATCH Score/Interventions                      Lactation Tools Discussed/Used     Consult Status      Sherlyn HayJennifer D Kyli Stone 05/29/2016, 5:15 PM

## 2016-05-30 LAB — HSV 1 ANTIBODY, IGG: HSV 1 GLYCOPROTEIN G AB, IGG: 34.3 {index} — AB (ref 0.00–0.90)

## 2016-05-30 LAB — CMV ANTIBODY, IGG (EIA): CMV Ab - IgG: 8.1 U/mL — ABNORMAL HIGH (ref 0.00–0.59)

## 2016-05-30 LAB — HSV 2 ANTIBODY, IGG: HSV 2 GLYCOPROTEIN G AB, IGG: 3.89 {index} — AB (ref 0.00–0.90)

## 2016-05-30 LAB — TOXOPLASMA GONDII ANTIBODY, IGG: Toxoplasma IgG Ratio: <3 IU/mL (ref 0.0–7.1)

## 2016-05-30 LAB — TSH: TSH: 2.118 u[IU]/mL (ref 0.350–4.500)

## 2016-05-30 MED ORDER — ZOLPIDEM TARTRATE 5 MG PO TABS: 5.0000 mg | ORAL_TABLET | Freq: Every evening | ORAL | 0 refills | Status: DC | PRN

## 2016-05-30 MED ORDER — IBUPROFEN 600 MG PO TABS: 600.0000 mg | ORAL_TABLET | Freq: Four times a day (QID) | ORAL | 0 refills | Status: AC | PRN

## 2016-05-30 MED ORDER — SERTRALINE HCL 25 MG PO TABS: 25.0000 mg | ORAL_TABLET | Freq: Every day | ORAL | 1 refills | Status: DC

## 2016-05-30 NOTE — Progress Notes (Signed)
I offered grief support to Sharon Stone and [facility]  I helped them anticipate some of their needs once they were at home and provided some grief education.  They were very receptive and appreciative.  Chaplain Dyanne CarrelKaty Doreena Maulden, Bcc Pager, [phone] 2:24 PM

## 2016-05-30 NOTE — Discharge Summary (Signed)
Obstetrical Discharge Summary  Date of Admission: 05/28/2016 Date of Discharge: 05/31/2016  [facility]  Gestational Age at Delivery: [id number]   Antepartum complications: BMI 46, [facility],  Reason for Admission: IUFD at 35wks Date of Delivery: 05/29/2016  Delivered By: Jen MowElizabeth Mumaw, DO Delivery Type: VBAC Intrapartum complications/course: None Anesthesia: epidural Placenta: Delivered and expressed via active management. Intact: yes. To pathology: yes.  Laceration: none Episiotomy: none EBL: 100mL Baby: Liveborn female, APGARs 0/0, weight 1505 g: a non-viable female infant en caul in cephalic, LOT position. Scant thick malodorous meconium fluid in sac before ROM. +Loose nuchal cordx1 with a true knot. Cord clamped x2 and cut. Placenta delivered spontaneously intact, small placenta with 3VC, cord blood was clotted, could not collect   Discharge Diagnosis: Delivered. Gestational HTN. Gestational thrombocytopenia  Postpartum course: Uncomplicated. She did have some mild range pressures with negative pre-eclampsia labs, except for slightly low platelets. She was started on sertraline during her hospital stay. 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 No intake/output data recorded.    Discharge Exam:  NAD Perineum: deferred Abdomen: firm fundus below the umbilicus. nttp RRR no MRGs CTAB  Recent Labs Lab 05/28/16 1750 05/29/16 0323 05/29/16 1156  WBC 9.6 8.6 10.1  HGB 9.9* 10.1* 9.7*  HCT 30.6* 30.5* 29.4*  PLT 131* 126* 128*    Disposition: Home  Rh Immune globulin given: not applicable Rubella vaccine  given: not applicable Tdap vaccine given in AP or PP setting: yes  Contraception: not d/w patient  Prenatal/Postnatal Panel: O POS//Rubella Immune//Varicella Immune//RPR negative//HIV negative/HepB Surface Ag negative//pap no abnormalities (date: 3//2016)  Plan:  Sharon Stone was discharged to home in good condition. Follow-up appointment with [facility] in 1 week for a mood check and BP check visit.   Discharge Medications: Allergies as of 05/30/2016      Reactions   Shellfish Allergy Anaphylaxis      Medication List    STOP taking these medications   prenatal multivitamin Tabs tablet     TAKE these medications   acetaminophen 500 MG tablet Commonly known as:  TYLENOL Take 1,000 mg by mouth every 6 (six) hours as needed for headache.   albuterol 108 (90 Base) MCG/ACT inhaler Commonly known as:  PROVENTIL HFA;VENTOLIN HFA Inhale into the lungs every 6 (six) hours as needed for wheezing or shortness of breath.   EPIPEN 2-PAK 0.3 mg/0.3 mL Soaj injection Generic drug:  EPINEPHrine Inject 0.3 mg into the muscle once.   ibuprofen 600 MG tablet Commonly known as:  ADVIL,MOTRIN Take 1 tablet (600 mg total) by mouth every 6 (six) hours as needed.   sertraline 25 MG tablet Commonly known as:  ZOLOFT Take 1 tablet (25 mg total) by mouth daily.   zolpidem 5 MG tablet Commonly known as:  AMBIEN Take 1 tablet (5 mg total) by mouth at bedtime as needed for sleep.       Sharon Copaharlie Carlicia Stone, Jr. MD Attending Center for [facility](Faculty Practice)

## 2016-05-30 NOTE — Discharge Instructions (Signed)
Vaginal Delivery, Care After Refer to this sheet in the next few weeks. These discharge instructions provide you with information on caring for yourself after delivery. Your caregiver may also give you specific instructions. Your treatment has been planned according to the most current medical practices available, but problems sometimes occur. Call your caregiver if you have any problems or questions after you go home. HOME CARE INSTRUCTIONS 1. Take over-the-counter or prescription medicines only as directed by your caregiver or pharmacist. 2. Do not drink alcohol, especially if you are breastfeeding or taking medicine to relieve pain. 3. Do not smoke tobacco. 4. Continue to use good perineal care. Good perineal care includes: 1. Wiping your perineum from back to front 2. Keeping your perineum clean. 3. You can do sitz baths twice a day, to help keep this area clean 5. Do not use tampons, douche or have sex until your caregiver says it is okay. 6. Shower only and avoid sitting in submerged water, aside from sitz baths 7. Wear a well-fitting bra that provides breast support. 8. Eat healthy foods. 9. Drink enough fluids to keep your urine clear or pale yellow. 10. Eat high-fiber foods such as whole grain cereals and breads, brown rice, beans, and fresh fruits and vegetables every day. These foods may help prevent or relieve constipation. 11. Avoid constipation with high fiber foods or medications, such as miralax or metamucil 12. Follow your caregiver's recommendations regarding resumption of activities such as climbing stairs, driving, lifting, exercising, or traveling. 13. Talk to your caregiver about resuming sexual activities. Resumption of sexual activities is dependent upon your risk of infection, your rate of healing, and your comfort and desire to resume sexual activity. 14. Try to have someone help you with your household activities and your newborn for at least a few days after you leave  the hospital. 15. Rest as much as possible. Try to rest or take a nap when your newborn is sleeping. 16. Increase your activities gradually. 17. Keep all of your scheduled postpartum appointments. It is very important to keep your scheduled follow-up appointments. At these appointments, your caregiver will be checking to make sure that you are healing physically and emotionally. SEEK MEDICAL CARE IF:   You are passing large clots from your vagina. Save any clots to show your caregiver.  You have a foul smelling discharge from your vagina.  You have trouble urinating.  You are urinating frequently.  You have pain when you urinate.  You have a change in your bowel movements.  You have increasing redness, pain, or swelling near your vaginal incision (episiotomy) or vaginal tear.  You have pus draining from your episiotomy or vaginal tear.  Your episiotomy or vaginal tear is separating.  You have painful, hard, or reddened breasts.  You have a severe headache.  You have blurred vision or see spots.  You feel sad or depressed.  You have thoughts of hurting yourself or your newborn.  You have questions about your care, the care of your newborn, or medicines.  You are dizzy or light-headed.  You have a rash.  You have nausea or vomiting.  You were breastfeeding and have not had a menstrual period within 12 weeks after you stopped breastfeeding.  You are not breastfeeding and have not had a menstrual period by the 12th week after delivery.  You have a fever. SEEK IMMEDIATE MEDICAL CARE IF:   You have persistent pain.  You have chest pain.  You have shortness of breath.    You faint.  You have leg pain.  You have stomach pain.  Your vaginal bleeding saturates two or more sanitary pads in 1 hour. MAKE SURE YOU:   Understand these instructions.  Will watch your condition.  Will get help right away if you are not doing well or get worse. Document Released:  01/11/2000 Document Revised: 05/30/2013 Document Reviewed: 09/10/2011 [facility] Patient Information 2015 [place], [place]. This information is not intended to replace advice given to you by your health care provider. Make sure you discuss any questions you have with your health care provider.  Sitz Bath A sitz bath is a warm water bath taken in the sitting position. The water covers only the hips and butt (buttocks). We recommend using one that fits in the toilet, to help with ease of use and cleanliness. It may be used for either healing or cleaning purposes. Sitz baths are also used to relieve pain, itching, or muscle tightening (spasms). The water may contain medicine. Moist heat will help you heal and relax.  HOME CARE  Take 3 to 4 sitz baths a day. 18. Fill the bathtub half-full with warm water. 19. Sit in the water and open the drain a little. 20. Turn on the warm water to keep the tub half-full. Keep the water running constantly. 21. Soak in the water for 15 to 20 minutes. 22. After the sitz bath, pat the affected area dry. GET HELP RIGHT AWAY IF: You get worse instead of better. Stop the sitz baths if you get worse. MAKE SURE YOU:  Understand these instructions.  Will watch your condition.  Will get help right away if you are not doing well or get worse. Document Released: 02/21/2004 Document Revised: 10/08/2011 Document Reviewed: 05/13/2010 [facility] Patient Information 2015 [place], [place]. This information is not intended to replace advice given to you by your health care provider. Make sure you discuss any questions you have with your health care provider.   Postpartum Depression and Baby Blues The postpartum period begins right after the birth of a baby. During this time, there is often a great amount of joy and excitement. It is also a time of many changes in the life of the parents. Regardless of how many times a mother gives birth, each child brings new challenges and dynamics  to the family. It is not unusual to have feelings of excitement along with confusing shifts in moods, emotions, and thoughts. All mothers are at risk of developing postpartum depression or the "baby blues." These mood changes can occur right after giving birth, or they may occur many months after giving birth. The baby blues or postpartum depression can be mild or severe. Additionally, postpartum depression can go away rather quickly, or it can be a long-term condition. What are the causes? Raised hormone levels and the rapid drop in those levels are thought to be a main cause of postpartum depression and the baby blues. A number of hormones change during and after pregnancy. Estrogen and progesterone usually decrease right after the delivery of your baby. The levels of thyroid hormone and various cortisol steroids also rapidly drop. Other factors that play a role in these mood changes include major life events and genetics. What increases the risk? If you have any of the following risks for the baby blues or postpartum depression, know what symptoms to watch out for during the postpartum period. Risk factors that may increase the likelihood of getting the baby blues or postpartum depression include: Having a personal or  family history of depression. Having depression while being pregnant. Having premenstrual mood issues or mood issues related to oral contraceptives. Having a lot of life stress. Having marital conflict. Lacking a social support network. Having a baby with special needs. Having health problems, such as diabetes. What are the signs or symptoms? Symptoms of baby blues include: Brief changes in mood, such as going from extreme happiness to sadness. Decreased concentration. Difficulty sleeping. Crying spells, tearfulness. Irritability. Anxiety. Symptoms of postpartum depression typically begin within the first month after giving birth. These symptoms include: Difficulty sleeping or  excessive sleepiness. Marked weight loss. Agitation. Feelings of worthlessness. Lack of interest in activity or food. Postpartum psychosis is a very serious condition and can be dangerous. Fortunately, it is rare. Displaying any of the following symptoms is cause for immediate medical attention. Symptoms of postpartum psychosis include: Hallucinations and delusions. Bizarre or disorganized behavior. Confusion or disorientation. How is this diagnosed? A diagnosis is made by an evaluation of your symptoms. There are no medical or lab tests that lead to a diagnosis, but there are various questionnaires that a health care provider may use to identify those with the baby blues, postpartum depression, or psychosis. Often, a screening tool called the [place] Postnatal Depression Scale is used to diagnose depression in the postpartum period. How is this treated? The baby blues usually goes away on its own in 1-2 weeks. Social support is often all that is needed. You will be encouraged to get adequate sleep and rest. Occasionally, you may be given medicines to help you sleep. Postpartum depression requires treatment because it can last several months or longer if it is not treated. Treatment may include individual or group therapy, medicine, or both to address any social, physiological, and psychological factors that may play a role in the depression. Regular exercise, a healthy diet, rest, and social support may also be strongly recommended. Postpartum psychosis is more serious and needs treatment right away. Hospitalization is often needed. Follow these instructions at home: Get as much rest as you can. Nap when the baby sleeps. Exercise regularly. Some women find yoga and walking to be beneficial. Eat a balanced and nourishing diet. Do little things that you enjoy. Have a cup of tea, take a bubble bath, read your favorite magazine, or listen to your favorite music. Avoid alcohol. Ask for help with  household chores, cooking, grocery shopping, or running errands as needed. Do not try to do everything. Talk to people close to you about how you are feeling. Get support from your partner, family members, friends, or other new moms. Try to stay positive in how you think. Think about the things you are grateful for. Do not spend a lot of time alone. Only take over-the-counter or prescription medicine as directed by your health care provider. Keep all your postpartum appointments. Let your health care provider know if you have any concerns. Contact a health care provider if: You are having a reaction to or problems with your medicine. Get help right away if: You have suicidal feelings. You think you may harm the baby or someone else. This information is not intended to replace advice given to you by your health care provider. Make sure you discuss any questions you have with your health care provider. Document Released: 10/18/2003 Document Revised: 06/21/2015 Document Reviewed: 10/25/2012 Elsevier Interactive Patient Education  2017 [employer]

## 2016-05-30 NOTE — Progress Notes (Signed)
OB Note Chaplain in room meeting with patient. Will come back later  Cornelia Copaharlie Idaly Verret, Jr MD Attending Center for [facility] (Faculty Practice) 05/30/2016 Time: [phone]

## 2016-05-30 NOTE — Progress Notes (Signed)
Patient discharged home with significant other. Discharge paperwork, teaching, home care, follow-up appts, and prescriptions reviewed. No questions at this time.

## 2016-05-31 DIAGNOSIS — F1210 Cannabis abuse, uncomplicated: Secondary | ICD-10-CM

## 2016-05-31 DIAGNOSIS — O139 Gestational [pregnancy-induced] hypertension without significant proteinuria, unspecified trimester: Secondary | ICD-10-CM | POA: Clinically undetermined

## 2016-05-31 DIAGNOSIS — Z98891 History of uterine scar from previous surgery: Secondary | ICD-10-CM

## 2016-05-31 DIAGNOSIS — D696 Thrombocytopenia, unspecified: Secondary | ICD-10-CM | POA: Diagnosis present

## 2016-05-31 DIAGNOSIS — O99119 Other diseases of the blood and blood-forming organs and certain disorders involving the immune mechanism complicating pregnancy, unspecified trimester: Secondary | ICD-10-CM

## 2016-06-03 ENCOUNTER — Ambulatory Visit: Payer: BLUE CROSS/BLUE SHIELD | Admitting: Obstetrics and Gynecology

## 2016-06-03 LAB — TORCH-IGM(TOXO/ RUB/ CMV/ HSV) W TITER
CMV IgM: <30 AU/mL (ref 0.0–29.9)
HSVI/II COMB AB IGM: 1.17 ratio — AB (ref 0.00–0.90)
Toxoplasma Antibody- IgM: <3 AU/mL (ref 0.0–7.9)

## 2016-06-03 LAB — INFECT DISEASE AB IGM REFLEX 1

## 2016-06-16 ENCOUNTER — Ambulatory Visit: Payer: BLUE CROSS/BLUE SHIELD | Admitting: Obstetrics and Gynecology

## 2016-06-17 ENCOUNTER — Telehealth (HOSPITAL_COMMUNITY): Payer: Self-pay | Admitting: *Deleted

## 2016-06-17 NOTE — Telephone Encounter (Signed)
Preadmission screen  

## 2016-06-18 ENCOUNTER — Other Ambulatory Visit: Payer: Self-pay | Admitting: Obstetrics & Gynecology

## 2016-06-20 ENCOUNTER — Encounter (HOSPITAL_COMMUNITY): Admission: RE | Admit: 2016-06-20 | Payer: BLUE CROSS/BLUE SHIELD | Source: Ambulatory Visit

## 2016-06-21 ENCOUNTER — Encounter (HOSPITAL_COMMUNITY): Payer: Self-pay

## 2016-06-23 ENCOUNTER — Inpatient Hospital Stay (HOSPITAL_COMMUNITY)
Admission: RE | Admit: 2016-06-23 | Payer: BLUE CROSS/BLUE SHIELD | Source: Ambulatory Visit | Admitting: Obstetrics and Gynecology

## 2016-06-23 ENCOUNTER — Encounter (HOSPITAL_COMMUNITY): Admission: RE | Payer: Self-pay | Source: Ambulatory Visit

## 2016-06-23 SURGERY — Surgical Case
Anesthesia: Regional | Site: Abdomen

## 2016-06-26 ENCOUNTER — Ambulatory Visit: Payer: BLUE CROSS/BLUE SHIELD | Admitting: Obstetrics and Gynecology

## 2016-06-26 ENCOUNTER — Other Ambulatory Visit: Payer: Self-pay | Admitting: Obstetrics and Gynecology

## 2016-06-26 DIAGNOSIS — O364XX0 Maternal care for intrauterine death, not applicable or unspecified: Secondary | ICD-10-CM

## 2016-09-15 ENCOUNTER — Emergency Department (HOSPITAL_COMMUNITY)
Admission: EM | Admit: 2016-09-15 | Discharge: 2016-09-15 | Discharge status: Against Medical Advice | Payer: BLUE CROSS/BLUE SHIELD | Attending: Emergency Medicine | Admitting: Emergency Medicine

## 2016-09-15 DIAGNOSIS — Z5321 Procedure and treatment not carried out due to patient leaving prior to being seen by health care provider: Secondary | ICD-10-CM | POA: Insufficient documentation

## 2016-09-15 NOTE — ED Notes (Signed)
Patient called to triage with no response

## 2016-09-16 ENCOUNTER — Encounter (HOSPITAL_COMMUNITY): Payer: Self-pay | Admitting: Emergency Medicine

## 2016-09-16 DIAGNOSIS — Z5321 Procedure and treatment not carried out due to patient leaving prior to being seen by health care provider: Secondary | ICD-10-CM | POA: Diagnosis not present

## 2016-09-16 DIAGNOSIS — R51 Headache: Secondary | ICD-10-CM | POA: Diagnosis present

## 2016-09-16 NOTE — ED Notes (Signed)
Called Pt in lobby to be roomed, no response. 

## 2016-09-16 NOTE — ED Triage Notes (Signed)
Patient here from home with complaints of left sided headache for three days right above left eye. Denies n/v. Extra strength tylenol with no relief.

## 2016-09-17 ENCOUNTER — Emergency Department (HOSPITAL_COMMUNITY)
Admission: EM | Admit: 2016-09-17 | Discharge: 2016-09-17 | Disposition: A | Discharge status: Home / Self Care | Payer: BLUE CROSS/BLUE SHIELD | Attending: Emergency Medicine | Admitting: Emergency Medicine

## 2016-09-17 NOTE — ED Notes (Signed)
Pt called to reassess vitals. 

## 2016-09-19 ENCOUNTER — Encounter (HOSPITAL_COMMUNITY): Payer: Self-pay | Admitting: *Deleted

## 2016-09-19 ENCOUNTER — Emergency Department (HOSPITAL_COMMUNITY)
Admission: EM | Admit: 2016-09-19 | Discharge: 2016-09-19 | Disposition: A | Discharge status: Home / Self Care | Payer: BLUE CROSS/BLUE SHIELD | Attending: Emergency Medicine | Admitting: Emergency Medicine

## 2016-09-19 DIAGNOSIS — J45909 Unspecified asthma, uncomplicated: Secondary | ICD-10-CM | POA: Insufficient documentation

## 2016-09-19 DIAGNOSIS — F17210 Nicotine dependence, cigarettes, uncomplicated: Secondary | ICD-10-CM | POA: Insufficient documentation

## 2016-09-19 DIAGNOSIS — J029 Acute pharyngitis, unspecified: Secondary | ICD-10-CM | POA: Diagnosis not present

## 2016-09-19 LAB — RAPID STREP SCREEN (MED CTR MEBANE ONLY): Streptococcus, Group A Screen (Direct): NEGATIVE

## 2016-09-19 MED ORDER — PENICILLIN G BENZATHINE 1200000 UNIT/2ML IM SUSP
1.2000 10*6.[IU] | Freq: Once | INTRAMUSCULAR | Status: AC
  Administered 2016-09-19: 1.2 10*6.[IU] via INTRAMUSCULAR
  Filled 2016-09-19: qty 2

## 2016-09-19 NOTE — ED Provider Notes (Signed)
[facility]-EMERGENCY DEPT Provider Note   CSN: [id number] Arrival date & time: 09/19/16  1421     History   Chief Complaint Chief Complaint  Patient presents with  . Sore Throat    HPI Sharon Stone is a 26 y.o. female.  The history is provided by the patient and medical records.  Sore Throat     26 year old female with history of asthma, presenting to the ED with sore throat. States this began yesterday but has been progressively worsening. She reports associated headache. No cough, nasal congestion, sinus pressure, vomiting. States she was running a fever last night. Does have a low-grade fever here in the ED. States she does work around a lot of people, has had a few sick contacts.  Past Medical History:  Diagnosis Date  . Asthma     Patient Active Problem List   Diagnosis Date Noted  . Marijuana abuse 05/31/2016  . History of VBAC 05/31/2016  . Gestational thrombocytopenia (HCC) 05/31/2016  . Gestational hypertension 05/31/2016  . Fetal demise, greater than 22 weeks, antepartum 05/28/2016  . Tobacco smoking affecting pregnancy   . Maternal morbid obesity, antepartum [facility])     Past Surgical History:  Procedure Laterality Date  . CESAREAN SECTION N/A 12/02/2014   Procedure: CESAREAN SECTION;  Surgeon: Tilda Burrow, MD;  Location: [facility] ORS;  Service: Obstetrics;  Laterality: N/A;  . WISDOM TOOTH EXTRACTION  2010    OB History    Gravida Para Term Preterm AB Living   3 3 2 1   2    SAB TAB Ectopic Multiple Live Births         0 2       Home Medications    Prior to Admission medications   Medication Sig Start Date End Date Taking? Authorizing Provider  acetaminophen (TYLENOL) 500 MG tablet Take 1,000 mg by mouth every 6 (six) hours as needed for headache.    [provider]  albuterol (PROVENTIL HFA;VENTOLIN HFA) 108 (90 Base) MCG/ACT inhaler Inhale into the lungs every 6 (six) hours as needed for wheezing or shortness of breath.    [provider]  EPINEPHrine (EPIPEN 2-PAK) 0.3 mg/0.3 mL IJ SOAJ injection Inject 0.3 mg into the muscle once.    [provider]  ibuprofen (ADVIL,MOTRIN) 600 MG tablet Take 1 tablet (600 mg total) by mouth every 6 (six) hours as needed. 05/30/16   Kenney Bing, MD  sertraline (ZOLOFT) 25 MG tablet Take 1 tablet (25 mg total) by mouth daily. 05/31/16   Alton Bing, MD  zolpidem (AMBIEN) 5 MG tablet Take 1 tablet (5 mg total) by mouth at bedtime as needed for sleep. 05/30/16   Weir Bing, MD    Family History Family History  Problem Relation Age of Onset  . Cancer Mother   . Diabetes Maternal Aunt   . Heart disease Maternal Grandmother   . Diabetes Maternal Grandmother   . Cancer Maternal Grandmother     Social History Social History  Substance Use Topics  . Smoking status: Current Every Day Smoker    Packs/day: 0.25    Types: Cigarettes  . Smokeless tobacco: Current User  . Alcohol use Yes     Allergies   Shellfish allergy   Review of Systems Review of Systems  Constitutional: Positive for fever.  HENT: Positive for sore throat.   All other systems reviewed and are negative.    Physical Exam Updated Vital Signs BP 125/82   Pulse 88   Temp  99.1 F (37.3 C) (Oral)   Resp 18   Ht 5\' 5"  (1.651 m)   Wt 122.5 kg (270 lb)   SpO2 98%   BMI 44.93 kg/m   Physical Exam  Constitutional: She is oriented to person, place, and time. She appears well-developed and well-nourished.  HENT:  Head: Normocephalic and atraumatic.  Mouth/Throat: Oropharynx is clear and moist.  Tonsils 2+ bilaterally with large amount of exudate; uvula midline without evidence of peritonsillar abscess; handling secretions appropriately; no difficulty swallowing or speaking; normal phonation without stridor  Eyes: Pupils are equal, round, and reactive to light. Conjunctivae and EOM are normal.  Neck: Normal range of motion.  Cardiovascular: Normal rate, regular rhythm and normal  heart sounds.   Pulmonary/Chest: Effort normal and breath sounds normal. No respiratory distress. She has no wheezes.  Abdominal: Soft. Bowel sounds are normal.  Musculoskeletal: Normal range of motion.  Neurological: She is alert and oriented to person, place, and time.  Skin: Skin is warm and dry.  Psychiatric: She has a normal mood and affect.  Nursing note and vitals reviewed.    ED Treatments / Results  Labs (all labs ordered are listed, but only abnormal results are displayed) Labs Reviewed  RAPID STREP SCREEN (NOT AT [facility])  CULTURE, GROUP A STREP [facility])    EKG  EKG Interpretation None       Radiology No results found.  Procedures Procedures (including critical care time)  Medications Ordered in ED Medications  penicillin g benzathine (BICILLIN LA) 1200000 UNIT/2ML injection 1.2 Million Units (1.2 Million Units Intramuscular Given 09/19/16 1552)     Initial Impression / Assessment and Plan / ED Course  I have reviewed the triage vital signs and the nursing notes.  Pertinent labs & imaging results that were available during my care of the patient were reviewed by me and considered in my medical decision making (see chart for details).  26 year old female here with sore throat. Began yesterday. Low-grade fever here but nontoxic in appearance. She does have tonsillar edema with exudates noted. Uvula remains midline. No evidence of peritonsillar abscess.  Airway patent.  Rapid strep is negative, culture pending.  Still have suspicion for strep pharyngitis, treated with bicillin here.  Will d/c home with supportive care.  Discussed plan with patient, she acknowledged understanding and agreed with plan of care.  Return precautions given for new or worsening symptoms.  Final Clinical Impressions(s) / ED Diagnoses   Final diagnoses:  Sore throat    New Prescriptions Discharge Medication List as of 09/19/2016  4:39 PM       Garlon Hatchet, PA-C 09/19/16 1656      Melene Plan, DO 09/22/16 1501

## 2016-09-19 NOTE — ED Triage Notes (Signed)
Pt c/o sore throat onset yesterday with headache, pt denies cough, A&O x4

## 2016-09-19 NOTE — Discharge Instructions (Signed)
Can take Tylenol or Motrin if you continue running fever. Follow-up with your primary care doctor. Return to the ED for new or worsening symptoms.

## 2016-09-21 LAB — CULTURE, GROUP A STREP (THRC)

## 2016-10-04 ENCOUNTER — Encounter (HOSPITAL_COMMUNITY): Payer: Self-pay

## 2016-10-04 ENCOUNTER — Emergency Department (HOSPITAL_COMMUNITY)
Admission: EM | Admit: 2016-10-04 | Discharge: 2016-10-04 | Disposition: A | Discharge status: Home / Self Care | Payer: BLUE CROSS/BLUE SHIELD | Attending: Emergency Medicine | Admitting: Emergency Medicine

## 2016-10-04 DIAGNOSIS — H60331 Swimmer's ear, right ear: Secondary | ICD-10-CM | POA: Insufficient documentation

## 2016-10-04 DIAGNOSIS — Z79899 Other long term (current) drug therapy: Secondary | ICD-10-CM | POA: Insufficient documentation

## 2016-10-04 DIAGNOSIS — J45909 Unspecified asthma, uncomplicated: Secondary | ICD-10-CM | POA: Diagnosis not present

## 2016-10-04 DIAGNOSIS — H9201 Otalgia, right ear: Secondary | ICD-10-CM | POA: Diagnosis present

## 2016-10-04 DIAGNOSIS — F17210 Nicotine dependence, cigarettes, uncomplicated: Secondary | ICD-10-CM | POA: Insufficient documentation

## 2016-10-04 MED ORDER — OFLOXACIN 0.3 % OP SOLN
5.0000 [drp] | Freq: Every day | OPHTHALMIC | Status: DC
  Administered 2016-10-04: 5 [drp] via OTIC
  Filled 2016-10-04: qty 5

## 2016-10-04 NOTE — Discharge Instructions (Signed)
Instill 5 drops of the ear antibiotic to the right ear twice daily for 10 days. Use the attached instructions to help you with this. Follow up with a primary care physician for reevaluation. Return to the ED immediately if any concerning signs or symptoms develop such as worsening pain, pain behind the ear, fevers, redness, swelling, or loss of hearing.

## 2016-10-04 NOTE — ED Triage Notes (Signed)
Pt called in front lobbyx 2 no answer.

## 2016-10-04 NOTE — ED Triage Notes (Signed)
Per PT, Pt is coming from home with complaints of right ear pain and brown discharged x 5 days. Pt reports history of drainage without the pain. Denies fevers.

## 2016-10-04 NOTE — ED Notes (Signed)
Declined W/C at D/C and was escorted to lobby by RN. 

## 2016-10-04 NOTE — ED Provider Notes (Signed)
[facility] Provider Note   CSN: [id number] Arrival date & time: 10/04/16  1323     History   Chief Complaint Chief Complaint  Patient presents with  . Otalgia    HPI Sharon Stone is a 26 y.o. female who presents today with chief complaint acute onset, with intermittent right ear pain for 5 days. She states pain is throbbing in nature and will come and go. She also reports brown discharge to her pillowcase when she awakens which she believes is coming from her ear. She has not tried anything for her symptoms. No aggravating or alleviating factors noted. Pain does not radiate. She denies fevers, chills, nasal congestion, sore throat, difficulty breathing or swallowing, or headaches. She denies tinnitus or hearing loss. She denies recent Q-tip use or trauma to the ear. She does wear a head wrap frequently.  The history is provided by the patient.    Past Medical History:  Diagnosis Date  . Asthma     Patient Active Problem List   Diagnosis Date Noted  . Marijuana abuse 05/31/2016  . History of VBAC 05/31/2016  . Gestational thrombocytopenia (HCC) 05/31/2016  . Gestational hypertension 05/31/2016  . Fetal demise, greater than 22 weeks, antepartum 05/28/2016  . Tobacco smoking affecting pregnancy   . Maternal morbid obesity, antepartum [facility])     Past Surgical History:  Procedure Laterality Date  . CESAREAN SECTION N/A 12/02/2014   Procedure: CESAREAN SECTION;  Surgeon: Tilda Burrow, MD;  Location: [facility];  Service: Obstetrics;  Laterality: N/A;  . WISDOM TOOTH EXTRACTION  2010    OB History    Gravida Para Term Preterm AB Living   SAB TAB Ectopic Multiple Live Births         0 2       Home Medications    Prior to Admission medications   Medication Sig Start Date End Date Taking? Authorizing Provider  acetaminophen (TYLENOL) 500 MG tablet Take 1,000 mg by mouth every 6 (six) hours as needed for headache.    [provider]    albuterol (PROVENTIL HFA;VENTOLIN HFA) 108 (90 Base) MCG/ACT inhaler Inhale into the lungs every 6 (six) hours as needed for wheezing or shortness of breath.    [provider]  EPINEPHrine (EPIPEN 2-PAK) 0.3 mg/0.3 mL IJ SOAJ injection Inject 0.3 mg into the muscle once.    [provider]  ibuprofen (ADVIL,MOTRIN) 600 MG tablet Take 1 tablet (600 mg total) by mouth every 6 (six) hours as needed. 05/30/16   Wallace Bing, MD  sertraline (ZOLOFT) 25 MG tablet Take 1 tablet (25 mg total) by mouth daily. 05/31/16   Salvo Bing, MD  zolpidem (AMBIEN) 5 MG tablet Take 1 tablet (5 mg total) by mouth at bedtime as needed for sleep. 05/30/16   [place] Bing, MD    Family History Family History  Problem Relation Age of Onset  . Cancer Mother   . Diabetes Maternal Aunt   . Heart disease Maternal Grandmother   . Diabetes Maternal Grandmother   . Cancer Maternal Grandmother     Social History Social History  Substance Use Topics  . Smoking status: Current Every Day Smoker    Packs/day: 0.25    Types: Cigarettes  . Smokeless tobacco: Current User  . Alcohol use Yes     Allergies   Shellfish allergy   Review of Systems Review of Systems  Constitutional: Negative for chills and fever.  HENT: Positive for ear discharge and ear pain. Negative for congestion, sneezing, sore throat, tinnitus and trouble swallowing.   Eyes: Negative for visual disturbance.  Respiratory: Negative for cough and shortness of breath.   Cardiovascular: Negative for chest pain.     Physical Exam Updated Vital Signs BP 134/71 (BP Location: Left Arm)   Pulse 76   Temp 98.1 F (36.7 C) (Oral)   Resp 16   Ht 5\' 5"  (1.651 m)   Wt 124.7 kg (275 lb)   SpO2 100%   BMI 45.76 kg/m   Physical Exam  Constitutional: She appears well-developed and well-nourished. No distress.  Resting comfortably in chair  HENT:  Head: Normocephalic and atraumatic.  Left Ear: External ear normal.  Nose:  Nose normal.  Mouth/Throat: Oropharynx is clear and moist.  No frontal or maxillary sinus TTP. TMs without erythema or bulging. There is a moderate amount of cerumen in the bilateral ear canals. Left ear is normal.  Right ear with pain on palpation of the tragus. No pain on palpation of the mastoid. There is pain on insertion of the ear speculum. There is a moderate amount of swelling, erythema, and yellow-brown discharge in the ear canal. No erythema or swelling noted to the external ear or the mastoid.  Eyes: Conjunctivae are normal. Right eye exhibits no discharge. Left eye exhibits no discharge.  Neck: No JVD present. No tracheal deviation present.  Cardiovascular: Normal rate.   Pulmonary/Chest: Effort normal.  Abdominal: She exhibits no distension.  Musculoskeletal: She exhibits no edema.  Neurological: She is alert.  Skin: Skin is warm and dry. No erythema.  Psychiatric: She has a normal mood and affect. Her behavior is normal.  Nursing note and vitals reviewed.    ED Treatments / Results  Labs (all labs ordered are listed, but only abnormal results are displayed) Labs Reviewed - No data to display  EKG  EKG Interpretation None       Radiology No results found.  Procedures Procedures (including critical care time)  Medications Ordered in ED Medications  ofloxacin (OCUFLOX) 0.3 % ophthalmic solution 5 drop (not administered)     Initial Impression / Assessment and Plan / ED Course  I have reviewed the triage vital signs and the nursing notes.  Pertinent labs & imaging results that were available during my care of the patient were reviewed by me and considered in my medical decision making (see chart for details).    Pt presenting with otitis externa. No canal occlusion, Pt afebrile in NAD. Exam non concerning for mastoiditis, cellulitis or malignant OE. Dc with ofloxacin drops.  Advised primary care follow up in 2-3 days if no improvement with treatment or no  complete resolution by 7 days. Earlier follow-up if patient develops rash , allergic reaction to medication, or loss of hearing. Discussed indications for return to the ED. Pt verbalized understanding of and agreement with plan and is safe for discharge home at this time.    Final Clinical Impressions(s) / ED Diagnoses   Final diagnoses:  Acute swimmer's ear of right side    New Prescriptions New Prescriptions   No medications on file     Bennye AlmFawze, Jevaughn Degollado A, PA-C 10/04/16 1624    Tilden Fossaees, Elizabeth, MD 10/07/16 1326

## 2017-06-10 IMAGING — US US MFM OB LIMITED
1 series · 15 of 20 positions shown · non-contrast
Comparison: none

[Series 1: us mfm ob limited · 20 acquisitions, 15 frames shown]
[im 1/20]
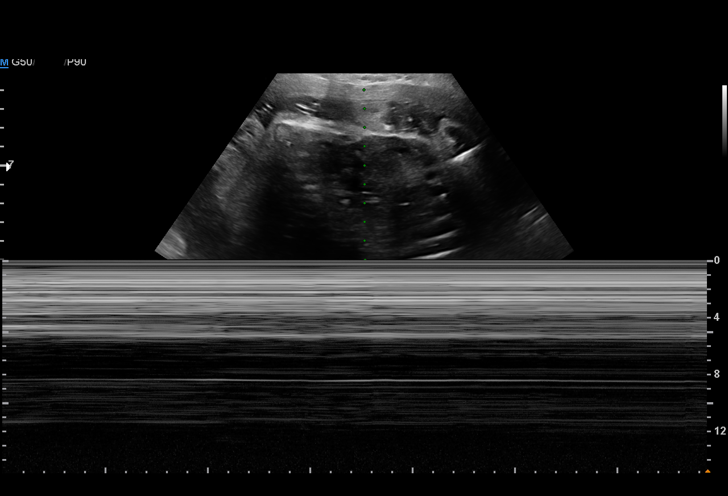
[im 3/20]
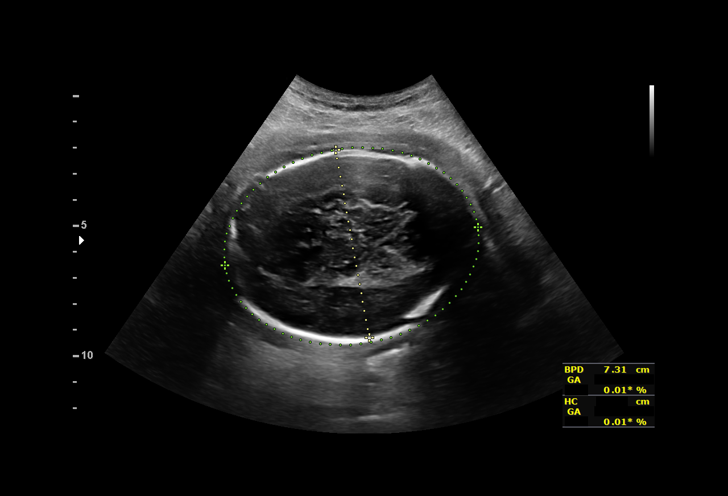
[im 4/20]
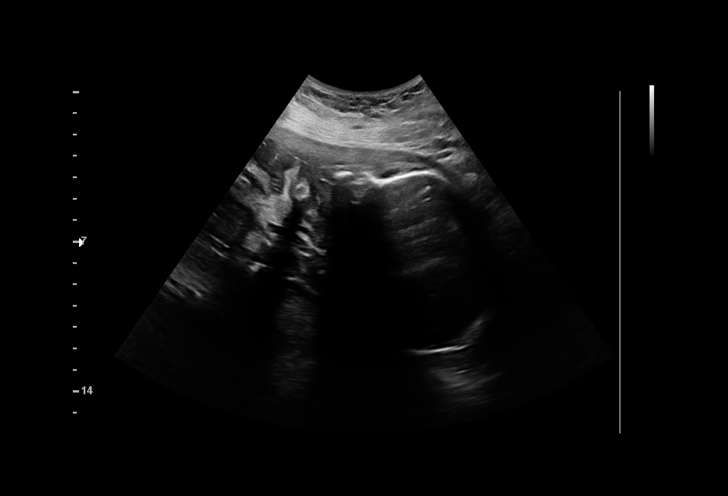
[im 5/20]
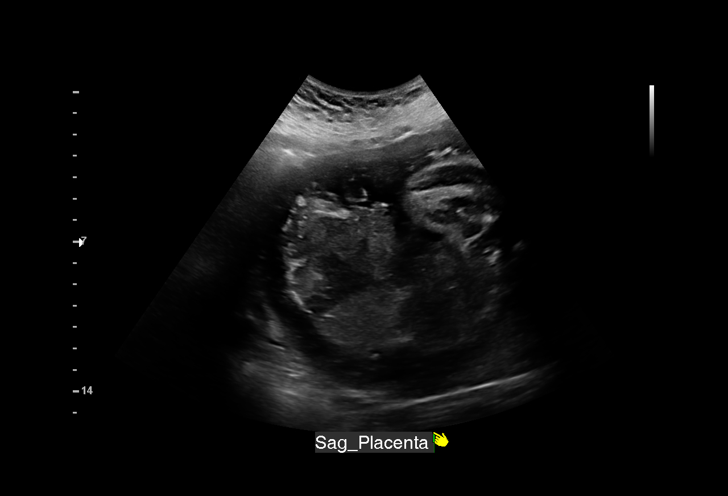
[im 7/20]
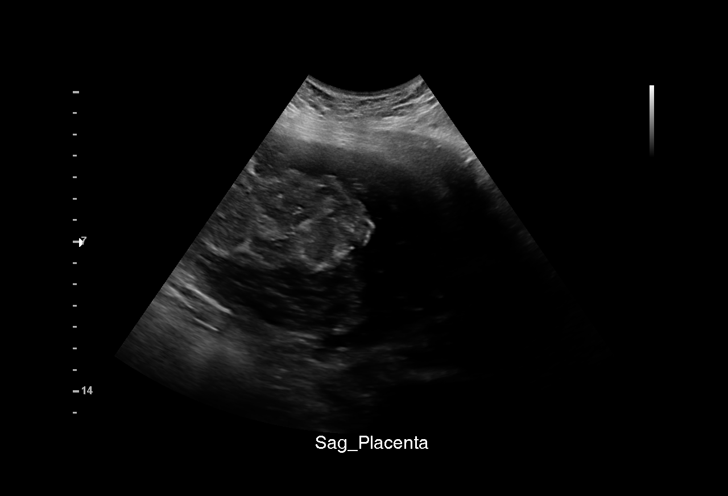
[im 8/20]
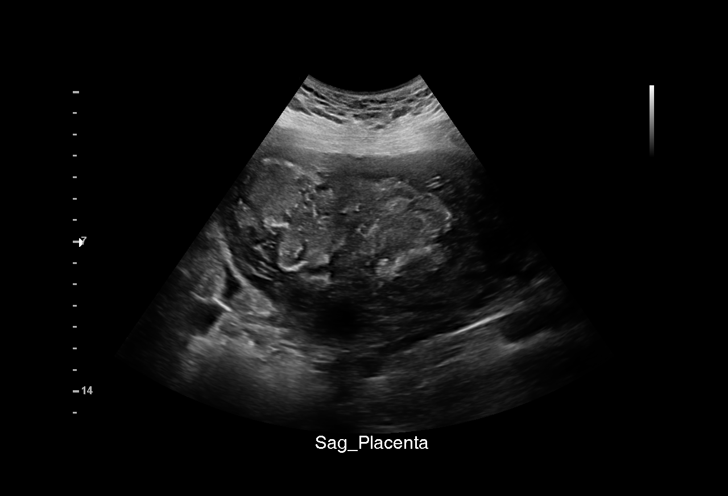
[im 9/20]
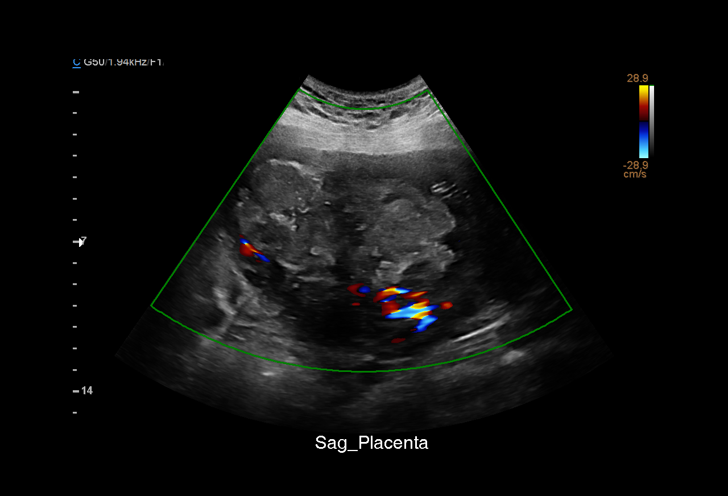
[im 11/20]
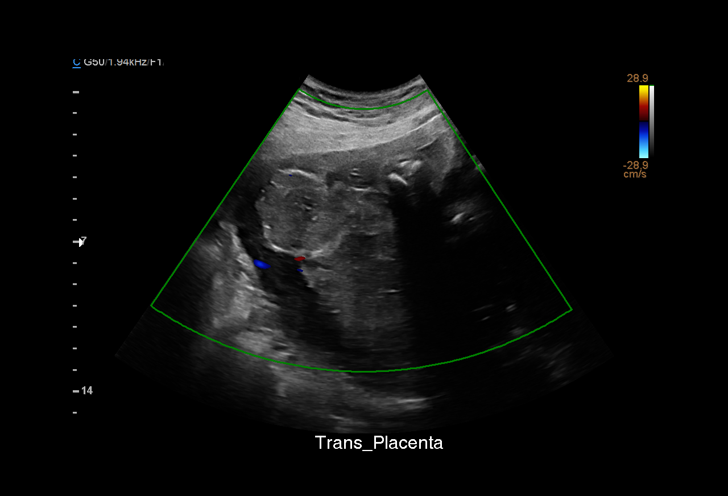
[im 12/20]
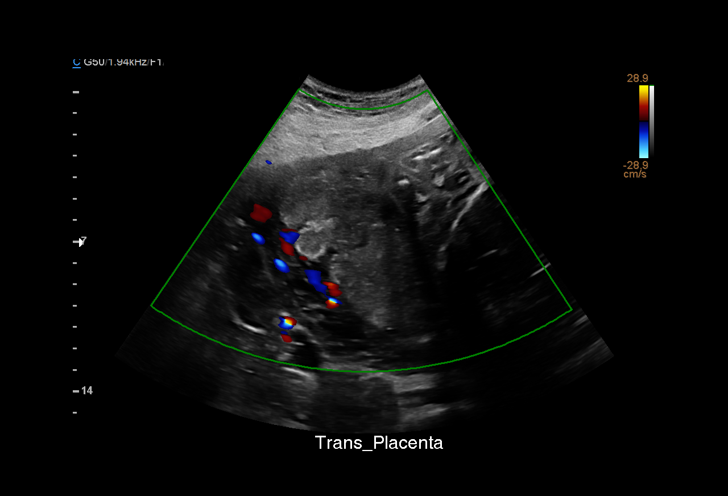
[im 13/20]
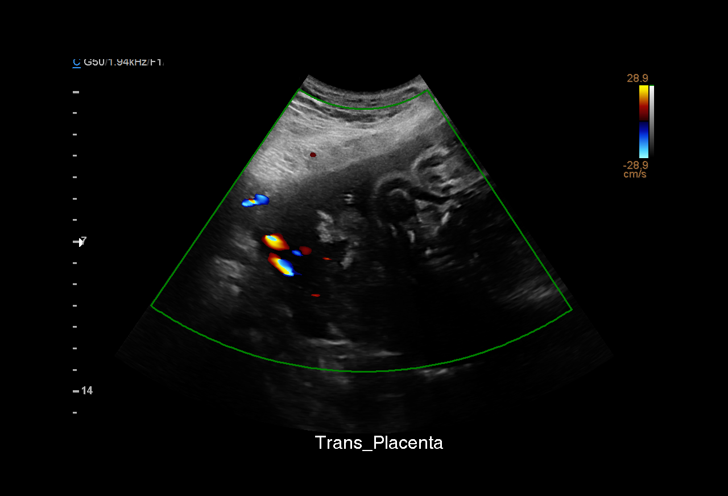
[im 15/20]
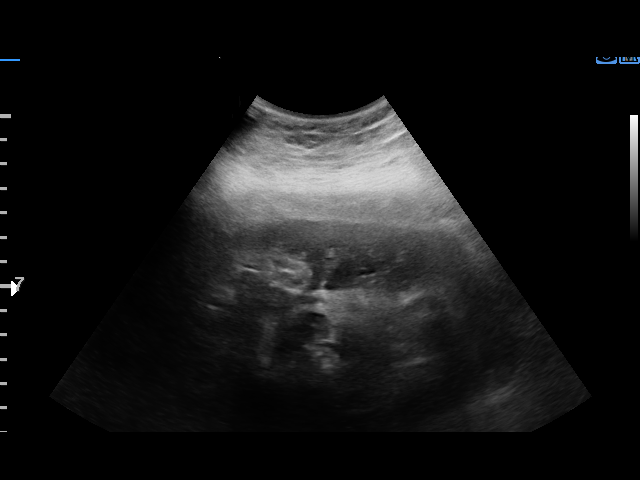
[im 16/20]
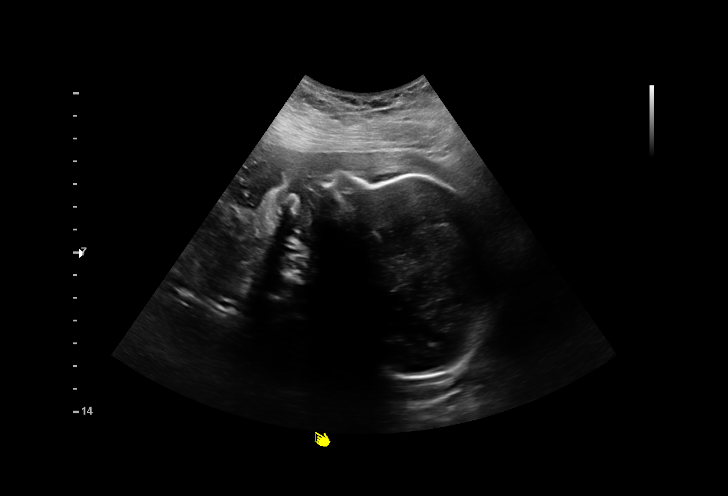
[im 17/20]
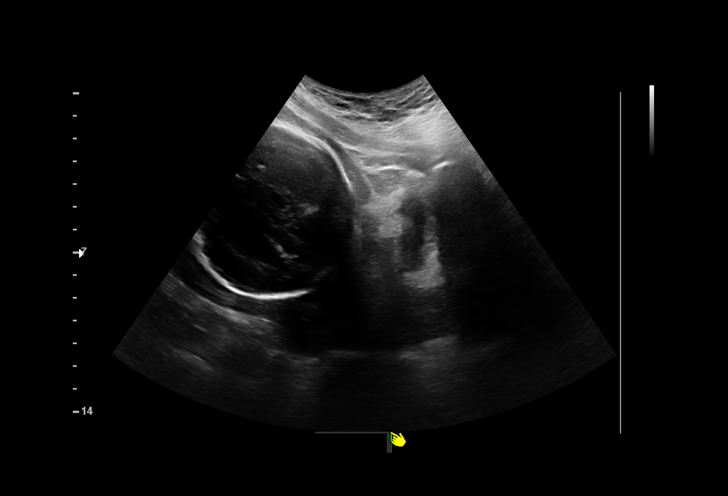
[im 19/20]
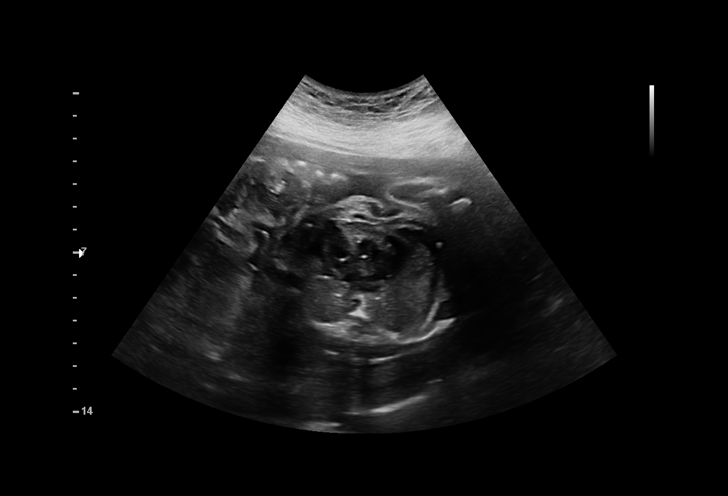
[im 20/20]
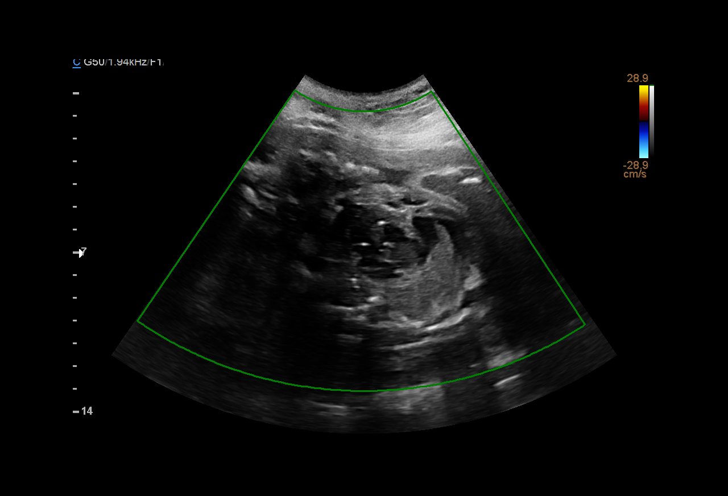

[15 of 20 positions shown; findings below may reference images not displayed]

Indications

35 weeks gestation of pregnancy
Absent fetal heart tones                       O76
Decreased fetal movement
OB History

Gravidity:    3         Term:   2        Prem:   0        SAB:   0
TOP:          0       Ectopic:  0        Living: 2
Fetal Evaluation

Num Of Fetuses:     1
Cardiac Activity:   Absent
Presentation:       Cephalic
Placenta:           Posterior, above cervical os

Amniotic Fluid
AFI FV:      Severe oligohydramnios
Biometry

BPD:      73.1  mm     G. Age:  29w 2d          0  %    CI:         71.8   %   70 - 86
FL/HC:      19.3   %   20.1 -
HC:      274.6  mm     G. Age:  30w 0d        < 3  %
FL/BPD:     72.6   %   71 - 87
FL:       53.1  mm     G. Age:  28w 2d        < 3  %
Gestational Age

LMP:           35w 4d       Date:   09/22/15                 EDD:   06/28/16
U/S Today:     29w 1d                                        EDD:   08/12/16
Best:          35w 4d    Det. By:   LMP  (09/22/15)          EDD:   06/28/16
Impression

Fetal demise at 35+4 weeks
Cephalic presentation
No cardiac activity
Unable to assess anatomy due to oligohydramnios
Severe oligohydramnios
FL and head measurements suspicious for severe fetal
growth restriction
Recommendations

Follow-up ultrasounds as clinically indicated.

## 2017-09-29 ENCOUNTER — Encounter (HOSPITAL_COMMUNITY): Payer: Self-pay | Admitting: *Deleted

## 2017-09-29 ENCOUNTER — Emergency Department (HOSPITAL_COMMUNITY)
Admission: EM | Admit: 2017-09-29 | Discharge: 2017-09-29 | Disposition: A | Discharge status: Home / Self Care | Payer: BLUE CROSS/BLUE SHIELD | Attending: Emergency Medicine | Admitting: Emergency Medicine

## 2017-09-29 DIAGNOSIS — F17210 Nicotine dependence, cigarettes, uncomplicated: Secondary | ICD-10-CM | POA: Insufficient documentation

## 2017-09-29 DIAGNOSIS — J45909 Unspecified asthma, uncomplicated: Secondary | ICD-10-CM | POA: Insufficient documentation

## 2017-09-29 DIAGNOSIS — Z79899 Other long term (current) drug therapy: Secondary | ICD-10-CM | POA: Insufficient documentation

## 2017-09-29 DIAGNOSIS — M79605 Pain in left leg: Secondary | ICD-10-CM | POA: Insufficient documentation

## 2017-09-29 LAB — D-DIMER, QUANTITATIVE: D-Dimer, Quant: <0.27 ug/mL-FEU (ref 0.00–0.50)

## 2017-09-29 MED ORDER — NAPROXEN 500 MG PO TABS: 500.0000 mg | ORAL_TABLET | Freq: Two times a day (BID) | ORAL | 0 refills | Status: AC

## 2017-09-29 NOTE — ED Triage Notes (Signed)
Patient to ED c/o L posterior thigh pain that radiates down to behind knee/calf onset yesterday morning. Denies known injury. Pain worse with ambulation and movement. Skin temp equal bilaterally, CSM intact distal to site of pain. No calf tenderness. Denies long plane/car rides.

## 2017-09-29 NOTE — Discharge Instructions (Signed)
I have provided medication for your pain. Please take as directed. If you experience any shortness of breath, or your symptoms worsen please return to the ED for reevaluation.

## 2017-09-29 NOTE — ED Provider Notes (Signed)
[facility] EMERGENCY DEPARTMENT Provider Note   CSN: [id number] Arrival date & time: 09/29/17  1204     History   Chief Complaint Chief Complaint  Patient presents with  . Leg Pain    HPI Sharon Stone is a 27 y.o. female.  27 y.o female with a PMH of of Marijuana abuse, tobacco abuse presents to the ED with a chief complaint of left leg pain x 1 day. Patient reports the pain again yesterday morning and it has gotten worse.  She reports the pain is mainly located around her right thigh and radiating to her right calf.  She reports pain is worse with movement walking and pressure.  She has attempted Lanelle for pain control but states no relief.  Patient is currently not on OCPs and denies any recent immobilization.  She denies any trauma, shortness of breath or chest pain.       Past Medical History:  Diagnosis Date  . Asthma     Patient Active Problem List   Diagnosis Date Noted  . Marijuana abuse 05/31/2016  . History of VBAC 05/31/2016  . Gestational thrombocytopenia (HCC) 05/31/2016  . Gestational hypertension 05/31/2016  . Fetal demise, greater than 22 weeks, antepartum 05/28/2016  . Tobacco smoking affecting pregnancy   . Maternal morbid obesity, antepartum [facility])     Past Surgical History:  Procedure Laterality Date  . CESAREAN SECTION N/A 12/02/2014   Procedure: CESAREAN SECTION;  Surgeon: Tilda Burrow, MD;  Location: [facility];  Service: Obstetrics;  Laterality: N/A;  . WISDOM TOOTH EXTRACTION  2010     OB History    Gravida  3   Para  3   Term  2   Preterm  1   AB      Living  2     SAB      TAB      Ectopic      Multiple  0   Live Births  2            Home Medications    Prior to Admission medications   Medication Sig Start Date End Date Taking? Authorizing Provider  acetaminophen (TYLENOL) 500 MG tablet Take 1,000 mg by mouth every 6 (six) hours as needed for headache.    [provider]    albuterol (PROVENTIL HFA;VENTOLIN HFA) 108 (90 Base) MCG/ACT inhaler Inhale into the lungs every 6 (six) hours as needed for wheezing or shortness of breath.    [provider]  EPINEPHrine (EPIPEN 2-PAK) 0.3 mg/0.3 mL IJ SOAJ injection Inject 0.3 mg into the muscle once.    [provider]  ibuprofen (ADVIL,MOTRIN) 600 MG tablet Take 1 tablet (600 mg total) by mouth every 6 (six) hours as needed. 05/30/16   [place] Bing, MD  naproxen (NAPROSYN) 500 MG tablet Take 1 tablet (500 mg total) by mouth 2 (two) times daily for 7 days. 09/29/17 10/06/17  Claude Manges, PA-C  sertraline (ZOLOFT) 25 MG tablet Take 1 tablet (25 mg total) by mouth daily. 05/31/16   [place] Bing, MD  zolpidem (AMBIEN) 5 MG tablet Take 1 tablet (5 mg total) by mouth at bedtime as needed for sleep. 05/30/16   [place] Bing, MD    Family History Family History  Problem Relation Age of Onset  . Cancer Mother   . Diabetes Maternal Aunt   . Heart disease Maternal Grandmother   . Diabetes Maternal Grandmother   . Cancer Maternal Grandmother  Social History Social History   Tobacco Use  . Smoking status: Current Every Day Smoker    Packs/day: 0.25    Types: Cigarettes  . Smokeless tobacco: Current User  Substance Use Topics  . Alcohol use: Yes  . Drug use: Yes    Types: Marijuana    Comment: pt describes herself as "pothead"     Allergies   Shellfish allergy   Review of Systems Review of Systems  Respiratory: Negative for shortness of breath.   Cardiovascular: Negative for chest pain and leg swelling.  Musculoskeletal: Positive for myalgias.  All other systems reviewed and are negative.    Physical Exam Updated Vital Signs BP 119/81 (BP Location: Right Arm)   Pulse 85   Resp 16   Ht 5\' 5"  (1.651 m)   Wt 116.6 kg   LMP 09/29/2017 (Exact Date)   SpO2 100%   BMI 42.77 kg/m   Physical Exam  Constitutional: She is oriented to person, place, and time. She appears  well-developed and well-nourished.  Neck: Normal range of motion. Neck supple.  Cardiovascular: Normal heart sounds.  Pulmonary/Chest: Effort normal and breath sounds normal.  Abdominal: Soft.  Musculoskeletal: She exhibits tenderness. She exhibits no edema.       Left upper leg: She exhibits tenderness.       Legs: Pain nonreproducible with palpation to left thigh, patient states the pain is worse when her calf is squeeze.Pulses are present.   RLE: KF,KE 5/5 strength HF,HE 5/5 strength   Neurological: She is alert and oriented to person, place, and time.  Skin: Skin is warm and dry.  Nursing note and vitals reviewed.    ED Treatments / Results  Labs (all labs ordered are listed, but only abnormal results are displayed) Labs Reviewed  D-DIMER, QUANTITATIVE (NOT AT [facility])    EKG None  Radiology No results found.  Procedures Procedures (including critical care time)  Medications Ordered in ED Medications - No data to display   Initial Impression / Assessment and Plan / ED Course  I have reviewed the triage vital signs and the nursing notes.  Pertinent labs & imaging results that were available during my care of the patient were reviewed by me and considered in my medical decision making (see chart for details).    Patient presents with left leg pain that began this morning. Pain is not reproducible with palpation.  He is a current smoker, and has a family history of blood clots in her family but not herself.  This time I have obtain a d-dimer level to rule out any blood clots in her leg.    D-dimer level 0.27, negative.  At this time I have advised patient is likely a pulled muscle.  I will provide patient naproxen for symptomatic relief.  Return precautions discussed.   Final Clinical Impressions(s) / ED Diagnoses   Final diagnoses:  Left leg pain    ED Discharge Orders         Ordered    naproxen (NAPROSYN) 500 MG tablet  2 times daily     09/29/17 1629            Claude Manges, PA-C 09/29/17 1635    Wynetta Fines, MD 10/01/17 [phone]

## 2017-09-29 NOTE — ED Triage Notes (Signed)
Pt in via EMS to triage c/o left leg pain that starts in her thigh down to her knee since yesterday, increased pain with ambulation

## 2017-09-29 NOTE — ED Notes (Signed)
ED Provider at bedside. 

## 2021-01-02 ENCOUNTER — Other Ambulatory Visit: Payer: Self-pay

## 2021-01-02 ENCOUNTER — Ambulatory Visit (HOSPITAL_COMMUNITY)
Admission: EM | Admit: 2021-01-02 | Discharge: 2021-01-02 | Disposition: A | Discharge status: Home / Self Care | Payer: Medicaid Other | Attending: Urgent Care | Admitting: Urgent Care

## 2021-01-02 DIAGNOSIS — J09X2 Influenza due to identified novel influenza A virus with other respiratory manifestations: Secondary | ICD-10-CM | POA: Diagnosis not present

## 2021-01-02 LAB — POC INFLUENZA A AND B ANTIGEN (URGENT CARE ONLY)
INFLUENZA A ANTIGEN, POC: POSITIVE — AB
INFLUENZA B ANTIGEN, POC: NEGATIVE

## 2021-01-02 MED ORDER — OSELTAMIVIR PHOSPHATE 75 MG PO CAPS: 75.0000 mg | ORAL_CAPSULE | Freq: Two times a day (BID) | ORAL | 0 refills | Status: AC

## 2021-01-02 NOTE — ED Triage Notes (Signed)
Pt presents with c/o dizziness, vomiting, cough and chills X 3 days.   States she took Tylenol.

## 2021-01-02 NOTE — Discharge Instructions (Signed)
Take tamiflu as discussed Try and stay hydrated with water, pedialyte or gatorade. Eat a very bland diet - soup, crackers, toast May try OTC oscillococcinum as needed for body aches Out of work until symptoms resolve; may return Monday

## 2021-01-02 NOTE — ED Provider Notes (Signed)
[facility]    CSN: [id number] Arrival date & time: 01/02/21  1033      History   Chief Complaint Chief Complaint  Patient presents with   Emesis   Cough   Dizziness    HPI Sharon Stone is a 30 y.o. female who presents today with complaints of dizziness, vomiting, cough, chills, diarrhea.  She states her 11-year-old son started with URI symptoms on Saturday, and her symptoms started the following day.  Sunday was primarily just a scratchy throat, which turned into a dry and slightly productive cough on Monday.  Yesterday however she started vomiting, and notes she vomited at least 9 times she states this morning she vomited another 2.  This morning she also started having watery diarrhea, and notes 4 bowel movements since awakening.  She denies a known fever.  She has not taken anything over-the-counter for her symptoms.  Patient denies any sick contacts apart from her kids.  She works from home.  She states the dizziness started this morning, after an entire day of nothing to eat yesterday.  She is however tolerating water.   Emesis Associated symptoms: cough, diarrhea and sore throat   Associated symptoms: no abdominal pain, no chills and no fever   Cough Associated symptoms: sore throat   Associated symptoms: no chest pain, no chills, no fever, no rhinorrhea, no shortness of breath and no wheezing   Dizziness Associated symptoms: diarrhea and vomiting   Associated symptoms: no chest pain and no shortness of breath    Past Medical History:  Diagnosis Date   Asthma     Patient Active Problem List   Diagnosis Date Noted   Marijuana abuse 05/31/2016   History of VBAC 05/31/2016   Gestational thrombocytopenia (HCC) 05/31/2016   Gestational hypertension 05/31/2016   Fetal demise, greater than 22 weeks, antepartum 05/28/2016   Tobacco smoking affecting pregnancy    Maternal morbid obesity, antepartum (HCC)     Past Surgical History:  Procedure Laterality  Date   CESAREAN SECTION N/A 12/02/2014   Procedure: CESAREAN SECTION;  Surgeon: Tilda Burrow, MD;  Location: [facility];  Service: Obstetrics;  Laterality: N/A;   WISDOM TOOTH EXTRACTION  2010    OB History     Gravida  3   Para  3   Term  2   Preterm  1   AB      Living  2      SAB      IAB      Ectopic      Multiple  0   Live Births  2            Home Medications    Prior to Admission medications   Medication Sig Start Date End Date Taking? Authorizing Provider  oseltamivir (TAMIFLU) 75 MG capsule Take 1 capsule (75 mg total) by mouth every 12 (twelve) hours for 5 days. 01/02/21 01/07/21 Yes Zyire Eidson L, PA  acetaminophen (TYLENOL) 500 MG tablet Take 1,000 mg by mouth every 6 (six) hours as needed for headache.    [provider]  albuterol (PROVENTIL HFA;VENTOLIN HFA) 108 (90 Base) MCG/ACT inhaler Inhale into the lungs every 6 (six) hours as needed for wheezing or shortness of breath.    [provider]  EPINEPHrine (EPIPEN 2-PAK) 0.3 mg/0.3 mL IJ SOAJ injection Inject 0.3 mg into the muscle once.    [provider]  ibuprofen (ADVIL,MOTRIN) 600 MG tablet Take 1 tablet (600 mg total) by  mouth every 6 (six) hours as needed. 05/30/16   [place] Bing, MD  sertraline (ZOLOFT) 25 MG tablet Take 1 tablet (25 mg total) by mouth daily. 05/31/16   [place] Bing, MD  zolpidem (AMBIEN) 5 MG tablet Take 1 tablet (5 mg total) by mouth at bedtime as needed for sleep. 05/30/16   [place] Bing, MD    Family History Family History  Problem Relation Age of Onset   Cancer Mother    Diabetes Maternal Aunt    Heart disease Maternal Grandmother    Diabetes Maternal Grandmother    Cancer Maternal Grandmother     Social History Social History   Tobacco Use   Smoking status: Every Day    Packs/day: 0.25    Types: Cigarettes   Smokeless tobacco: Current  Substance Use Topics   Alcohol use: Yes   Drug use: Yes    Types: Marijuana     Comment: pt describes herself as "pothead"     Allergies   Shellfish allergy   Review of Systems Review of Systems  Constitutional:  Positive for appetite change. Negative for chills, fatigue and fever.  HENT:  Positive for sore throat. Negative for rhinorrhea and sinus pressure.   Eyes:  Negative for pain.  Respiratory:  Positive for cough. Negative for chest tightness, shortness of breath, wheezing and stridor.   Cardiovascular:  Negative for chest pain.  Gastrointestinal:  Positive for diarrhea and vomiting. Negative for abdominal pain and constipation.  Neurological:  Positive for dizziness.    Physical Exam Triage Vital Signs ED Triage Vitals  Enc Vitals Group     BP 01/02/21 1259 (!) 136/93     Pulse Rate 01/02/21 1259 87     Resp 01/02/21 1259 16     Temp 01/02/21 1259 98 F (36.7 C)     Temp Source 01/02/21 1259 Oral     SpO2 01/02/21 1259 97 %     Weight --      Height --      Head Circumference --      Peak Flow --      Pain Score 01/02/21 1258 5     Pain Loc --      Pain Edu? --      Excl. in GC? --    No data found.  Updated Vital Signs BP (!) 136/93 (BP Location: Left Arm)   Pulse 87   Temp 98 F (36.7 C) (Oral)   Resp 16   LMP 12/28/2018 (Exact Date)   SpO2 97%   Visual Acuity Right Eye Distance:   Left Eye Distance:   Bilateral Distance:    Right Eye Near:   Left Eye Near:    Bilateral Near:     Physical Exam Vitals and nursing note reviewed. Exam conducted with a chaperone present.  Constitutional:      General: She is not in acute distress.    Appearance: She is obese. She is ill-appearing. She is not toxic-appearing.  HENT:     Head: Normocephalic and atraumatic.     Right Ear: Tympanic membrane, ear canal and external ear normal.     Left Ear: Tympanic membrane, ear canal and external ear normal.     Nose: Nose normal. No congestion or rhinorrhea.     Mouth/Throat:     Mouth: Mucous membranes are moist.     Pharynx: Oropharynx  is clear.  Eyes:     Extraocular Movements: Extraocular movements intact.     Conjunctiva/sclera: Conjunctivae  normal.     Pupils: Pupils are equal, round, and reactive to light.  Cardiovascular:     Rate and Rhythm: Normal rate and regular rhythm.     Heart sounds: No murmur heard. Pulmonary:     Effort: Pulmonary effort is normal. No respiratory distress.     Breath sounds: Normal breath sounds. No wheezing or rhonchi.  Abdominal:     General: Abdomen is flat. Bowel sounds are normal. There is no distension.     Palpations: Abdomen is soft.     Tenderness: There is no abdominal tenderness. There is no right CVA tenderness, left CVA tenderness, guarding or rebound.  Musculoskeletal:        General: Normal range of motion.     Cervical back: Normal range of motion and neck supple. No rigidity or tenderness.  Skin:    General: Skin is warm.  Neurological:     Mental Status: She is alert.  Psychiatric:        Mood and Affect: Mood normal.     UC Treatments / Results  Labs (all labs ordered are listed, but only abnormal results are displayed) Labs Reviewed  POC INFLUENZA A AND B ANTIGEN (URGENT CARE ONLY) - Abnormal; Notable for the following components:      Result Value   INFLUENZA A ANTIGEN, POC POSITIVE (*)    All other components within normal limits    EKG   Radiology No results found.  Procedures Procedures (including critical care time)  Medications Ordered in UC Medications - No data to display  Initial Impression / Assessment and Plan / UC Course  I have reviewed the triage vital signs and the nursing notes.  Pertinent labs & imaging results that were available during my care of the patient were reviewed by me and considered in my medical decision making (see chart for details).     Influenza A - pts sx started in essence 24 hours ago. Will start tx with tamiflu, otc meds, supportive care. Out of work, rest and hydrate. Vomiting and diarrhea -  supportive care. Secondary to #1. Continue with fluids - water, pedialyte, gatorade. Gently advance diet as tolerated. No signs of dehydration clinically today. Elevated BP - follow up with PCP when feeling better for management.    Final Clinical Impressions(s) / UC Diagnoses   Final diagnoses:  Influenza due to identified novel influenza A virus with other respiratory manifestations     Discharge Instructions      Take tamiflu as discussed Try and stay hydrated with water, pedialyte or gatorade. Eat a very bland diet - soup, crackers, toast May try OTC oscillococcinum as needed for body aches Out of work until symptoms resolve; may return Monday     ED Prescriptions     Medication Sig Dispense Auth. Provider   oseltamivir (TAMIFLU) 75 MG capsule Take 1 capsule (75 mg total) by mouth every 12 (twelve) hours for 5 days. 10 capsule Jackye Dever L, PA      PDMP not reviewed this encounter.   Maretta Bees, [place] 01/02/21 1534

## 2021-08-04 ENCOUNTER — Inpatient Hospital Stay: Admission: RE | Admit: 2021-08-04 | Payer: Self-pay | Source: Ambulatory Visit

## 2021-08-12 ENCOUNTER — Encounter: Payer: Self-pay | Admitting: Nurse Practitioner

## 2021-08-19 ENCOUNTER — Emergency Department
Admission: RE | Admit: 2021-08-19 | Discharge: 2021-08-19 | Disposition: A | Discharge status: Home / Self Care | Payer: Medicaid Other | Source: Ambulatory Visit | Attending: Family Medicine | Admitting: Family Medicine

## 2021-08-19 ENCOUNTER — Other Ambulatory Visit: Payer: Self-pay

## 2021-08-19 VITALS — BP 130/88 | HR 96 | Temp 98.4°F | Resp 18 | Ht 65.0 in | Wt 342.0 lb

## 2021-08-19 DIAGNOSIS — H6593 Unspecified nonsuppurative otitis media, bilateral: Secondary | ICD-10-CM | POA: Diagnosis not present

## 2021-08-19 MED ORDER — AMOXICILLIN-POT CLAVULANATE 875-125 MG PO TABS: 1.0000 | ORAL_TABLET | Freq: Two times a day (BID) | ORAL | 0 refills | Status: AC

## 2021-08-19 NOTE — ED Provider Notes (Signed)
[name]    CSN: [id number] Arrival date & time: 08/19/21  1456      History   Chief Complaint Chief Complaint  Patient presents with   Ear Drainage    Entered by patient   Otalgia    HPI Sharon Stone is a 30 y.o. female.   HPI Pleasant 31 year old female presents with bilateral ear pain x3 days.  PMH significant for morbid obesity, gestational hypertension, current cigarette smoker, and marijuana abuse.  Past Medical History:  Diagnosis Date   Asthma     Patient Active Problem List   Diagnosis Date Noted   Marijuana abuse 05/31/2016   History of VBAC 05/31/2016   Gestational thrombocytopenia ([place]) 05/31/2016   Gestational hypertension 05/31/2016   Fetal demise, greater than 22 weeks, antepartum 05/28/2016   Tobacco smoking affecting pregnancy    Maternal morbid obesity, antepartum [facility])     Past Surgical History:  Procedure Laterality Date   CESAREAN SECTION N/A 12/02/2014   Procedure: CESAREAN SECTION;  Surgeon: Jonnie Kind, MD;  Location: [facility];  Service: Obstetrics;  Laterality: N/A;   WISDOM TOOTH EXTRACTION  2010    OB History     Gravida  3   Para  3   Term  2   Preterm  1   AB      Living  2      SAB      IAB      Ectopic      Multiple  0   Live Births  2            Home Medications    Prior to Admission medications   Medication Sig Start Date End Date Taking? Authorizing Provider  amoxicillin-clavulanate (AUGMENTIN) 875-125 MG tablet Take 1 tablet by mouth 2 (two) times daily for 10 days. 08/19/21 08/29/21 Yes Eliezer Lofts, FNP  acetaminophen (TYLENOL) 500 MG tablet Take 1,000 mg by mouth every 6 (six) hours as needed for headache.    [provider]  EPINEPHrine (EPIPEN 2-PAK) 0.3 mg/0.3 mL IJ SOAJ injection Inject 0.3 mg into the muscle once.    [provider]  ibuprofen (ADVIL,MOTRIN) 600 MG tablet Take 1 tablet (600 mg total) by mouth every 6 (six) hours as needed. 05/30/16    Aletha Halim, MD    Family History Family History  Problem Relation Age of Onset   Cancer Mother    Diabetes Maternal Aunt    Heart disease Maternal Grandmother    Diabetes Maternal Grandmother    Cancer Maternal Grandmother     Social History Social History   Tobacco Use   Smoking status: Every Day    Packs/day: 0.25    Types: Cigarettes   Smokeless tobacco: Current  Vaping Use   Vaping Use: Never used  Substance Use Topics   Alcohol use: Yes   Drug use: Yes    Types: Marijuana    Comment: pt describes herself as "pothead"     Allergies   Shellfish allergy   Review of Systems Review of Systems  HENT:  Positive for ear pain.   All other systems reviewed and are negative.    Physical Exam Triage Vital Signs ED Triage Vitals  Enc Vitals Group     BP 08/19/21 1512 130/88     Pulse Rate 08/19/21 1512 96     Resp 08/19/21 1512 18     Temp 08/19/21 1512 98.4 F (36.9 C)     Temp Source 08/19/21 1512  Oral     SpO2 08/19/21 1512 99 %     Weight 08/19/21 1513 (!) 342 lb (155.1 kg)     Height 08/19/21 1513 5\' 5"  (1.651 m)     Head Circumference --      Peak Flow --      Pain Score 08/19/21 1513 2     Pain Loc --      Pain Edu? --      Excl. in GC? --    No data found.  Updated Vital Signs BP 130/88 (BP Location: Left Arm)   Pulse 96   Temp 98.4 F (36.9 C) (Oral)   Resp 18   Ht 5\' 5"  (1.651 m)   Wt (!) 342 lb (155.1 kg)   SpO2 99%   BMI 56.91 kg/m    Physical Exam Vitals and nursing note reviewed.  Constitutional:      General: She is not in acute distress.    Appearance: Normal appearance. She is obese. She is not ill-appearing.  HENT:     Head: Normocephalic and atraumatic.     Right Ear: External ear normal.     Left Ear: External ear normal.     Ears:     Comments: Moderate eustachian dysfunction tube noted bilaterally; bilateral TM's red rimmed, retracted with trace serous effusions noted    Mouth/Throat:     Mouth: Mucous  membranes are moist.     Pharynx: Oropharynx is clear.  Eyes:     Extraocular Movements: Extraocular movements intact.     Conjunctiva/sclera: Conjunctivae normal.     Pupils: Pupils are equal, round, and reactive to light.  Cardiovascular:     Rate and Rhythm: Normal rate and regular rhythm.     Pulses: Normal pulses.     Heart sounds: Normal heart sounds. No murmur heard. Pulmonary:     Effort: Pulmonary effort is normal.     Breath sounds: Normal breath sounds. No wheezing, rhonchi or rales.  Musculoskeletal:     Cervical back: Normal range of motion and neck supple. No tenderness.  Lymphadenopathy:     Cervical: No cervical adenopathy.  Skin:    General: Skin is warm and dry.  Neurological:     General: No focal deficit present.     Mental Status: She is alert and oriented to person, place, and time.      UC Treatments / Results  Labs (all labs ordered are listed, but only abnormal results are displayed) Labs Reviewed - No data to display  EKG   Radiology No results found.  Procedures Procedures (including critical care time)  Medications Ordered in UC Medications - No data to display  Initial Impression / Assessment and Plan / UC Course  I have reviewed the triage vital signs and the nursing notes.  Pertinent labs & imaging results that were available during my care of the patient were reviewed by me and considered in my medical decision making (see chart for details).     MDM: Bilateral otitis media-Rx'd Augmentin. Instructed patient to take medication as directed with food to completion.  Encouraged patient increase daily water intake while taking this medication.  Advised/encouraged patient not to submerge head underwater for the next 7 to 10 days.  Advised patient if symptoms worsen and/or unresolved please follow-up with PCP or here for further evaluation.  Patient discharged home, hemodynamically stable.  Final Clinical Impressions(s) / UC Diagnoses    Final diagnoses:  Bilateral otitis media with effusion  Discharge Instructions      Instructed patient to take medication as directed with food to completion.  Encouraged patient increase daily water intake while taking this medication.  Advised/encouraged patient not to submerge head underwater for the next 7 to 10 days.  Advised patient if symptoms worsen and/or unresolved please follow-up with PCP or here for further evaluation.     ED Prescriptions     Medication Sig Dispense Auth. Provider   amoxicillin-clavulanate (AUGMENTIN) 875-125 MG tablet Take 1 tablet by mouth 2 (two) times daily for 10 days. 20 tablet Sharon Iha, FNP      PDMP not reviewed this encounter.   Sharon Iha, FNP 08/19/21 1556

## 2021-08-19 NOTE — ED Triage Notes (Signed)
Bi-lateral ear pain x 3 days 

## 2021-08-19 NOTE — Discharge Instructions (Addendum)
Instructed patient to take medication as directed with food to completion.  Encouraged patient increase daily water intake while taking this medication.  Advised/encouraged patient not to submerge head underwater for the next 7 to 10 days.  Advised patient if symptoms worsen and/or unresolved please follow-up with PCP or here for further evaluation.

## 2022-02-10 ENCOUNTER — Ambulatory Visit
Admission: RE | Admit: 2022-02-10 | Discharge: 2022-02-10 | Disposition: A | Discharge status: Home / Self Care | Payer: Commercial Managed Care - PPO | Source: Ambulatory Visit | Attending: Family Medicine | Admitting: Family Medicine

## 2022-02-10 VITALS — BP 123/81 | HR 81 | Temp 98.7°F | Resp 18 | Ht 65.0 in | Wt 350.0 lb

## 2022-02-10 DIAGNOSIS — H1031 Unspecified acute conjunctivitis, right eye: Secondary | ICD-10-CM | POA: Diagnosis not present

## 2022-02-10 MED ORDER — TOBRAMYCIN 0.3 % OP SOLN: 1.0000 [drp] | OPHTHALMIC | 0 refills | Status: DC

## 2022-02-10 MED ORDER — AMOXICILLIN-POT CLAVULANATE 875-125 MG PO TABS: 1.0000 | ORAL_TABLET | Freq: Two times a day (BID) | ORAL | 0 refills | Status: DC

## 2022-02-10 NOTE — ED Triage Notes (Signed)
Patient c/o right eye soreness, matted, itchiness, no blurred vision.  Patient does not wear glasses or contacts.

## 2022-02-10 NOTE — ED Provider Notes (Signed)
[facility]    CSN: [id number] Arrival date & time: 02/10/22  1056      History   Chief Complaint Chief Complaint  Patient presents with   Eye Problem    Eye is swollen, sore, and crusty. - Entered by patient    HPI Sharon Stone is a 32 y.o. female.   HPI  Patient states that she had some eye irritation yesterday.  This morning she woke up it was swollen, crusted shut with pus, draining.  There are some redness of the eye.  No trauma.  She does not wear glasses or contacts.  She does not have cold symptoms or fever.  Past Medical History:  Diagnosis Date   Asthma     Patient Active Problem List   Diagnosis Date Noted   Marijuana abuse 05/31/2016   History of VBAC 05/31/2016   Gestational thrombocytopenia ([place]) 05/31/2016   Gestational hypertension 05/31/2016   Fetal demise, greater than 22 weeks, antepartum 05/28/2016   Tobacco smoking affecting pregnancy    Maternal morbid obesity, antepartum [facility])     Past Surgical History:  Procedure Laterality Date   CESAREAN SECTION N/A 12/02/2014   Procedure: CESAREAN SECTION;  Surgeon: Jonnie Kind, MD;  Location: [facility];  Service: Obstetrics;  Laterality: N/A;   WISDOM TOOTH EXTRACTION  2010    OB History     Gravida  3   Para  3   Term  2   Preterm  1   AB      Living  2      SAB      IAB      Ectopic      Multiple  0   Live Births  2            Home Medications    Prior to Admission medications   Medication Sig Start Date End Date Taking? Authorizing Provider  acetaminophen (TYLENOL) 500 MG tablet Take 1,000 mg by mouth every 6 (six) hours as needed for headache.   Yes [provider]  amoxicillin-clavulanate (AUGMENTIN) 875-125 MG tablet Take 1 tablet by mouth every 12 (twelve) hours. 02/10/22  Yes Raylene Everts, MD  EPINEPHrine (EPIPEN 2-PAK) 0.3 mg/0.3 mL IJ SOAJ injection Inject 0.3 mg into the muscle once.   Yes [provider]  ibuprofen  (ADVIL,MOTRIN) 600 MG tablet Take 1 tablet (600 mg total) by mouth every 6 (six) hours as needed. 05/30/16  Yes Aletha Halim, MD  tobramycin (TOBREX) 0.3 % ophthalmic solution Place 1 drop into both eyes every 4 (four) hours. 02/10/22  Yes Raylene Everts, MD    Family History Family History  Problem Relation Age of Onset   Cancer Mother    Heart disease Maternal Grandmother    Diabetes Maternal Grandmother    Cancer Maternal Grandmother    Diabetes Maternal Aunt     Social History Social History   Tobacco Use   Smoking status: Every Day    Packs/day: 0.25    Types: Cigarettes   Smokeless tobacco: Current  Vaping Use   Vaping Use: Never used  Substance Use Topics   Alcohol use: Yes   Drug use: Yes    Types: Marijuana    Comment: pt describes herself as "pothead"     Allergies   Shellfish allergy   Review of Systems Review of Systems See HPI  Physical Exam Triage Vital Signs ED Triage Vitals  Enc Vitals Group     BP 02/10/22  1105 123/81     Pulse Rate 02/10/22 1105 81     Resp 02/10/22 1105 18     Temp 02/10/22 1105 98.7 F (37.1 C)     Temp Source 02/10/22 1105 Oral     SpO2 02/10/22 1105 98 %     Weight 02/10/22 1106 (!) 350 lb (158.8 kg)     Height 02/10/22 1106 5\' 5"  (1.651 m)     Head Circumference --      Peak Flow --      Pain Score 02/10/22 1106 0     Pain Loc --      Pain Edu? --      Excl. in GC? --    No data found.  Updated Vital Signs BP 123/81 (BP Location: Right Arm)   Pulse 81   Temp 98.7 F (37.1 C) (Oral)   Resp 18   Ht 5\' 5"  (1.651 m)   Wt (!) 158.8 kg   LMP 01/22/2022 (Exact Date)   SpO2 98%   BMI 58.24 kg/m     Physical Exam Constitutional:      General: She is not in acute distress.    Appearance: She is well-developed.  HENT:     Head: Normocephalic and atraumatic.  Eyes:     Pupils: Pupils are equal, round, and reactive to light.     Comments: Right eye has mild soft tissue swelling upper and lower lids.   Mild conjunctival injection.  Yellow crusting at medial and lateral canthus  Cardiovascular:     Rate and Rhythm: Normal rate.  Pulmonary:     Effort: Pulmonary effort is normal. No respiratory distress.  Abdominal:     General: There is no distension.     Palpations: Abdomen is soft.  Musculoskeletal:        General: Normal range of motion.     Cervical back: Normal range of motion.  Skin:    General: Skin is warm and dry.  Neurological:     Mental Status: She is alert.      UC Treatments / Results  Labs (all labs ordered are listed, but only abnormal results are displayed) Labs Reviewed - No data to display  EKG   Radiology No results found.  Procedures Procedures (including critical care time)  Medications Ordered in UC Medications - No data to display  Initial Impression / Assessment and Plan / UC Course  I have reviewed the triage vital signs and the nursing notes.  Pertinent labs & imaging results that were available during my care of the patient were reviewed by me and considered in my medical decision making (see chart for details).     Because she has swelling of her eyelids and tenderness, I am going to put her on oral antibiotic as well as the eyedrop. Final Clinical Impressions(s) / UC Diagnoses   Final diagnoses:  Acute bacterial conjunctivitis of right eye     Discharge Instructions      Take the Augmentin antibiotic every 12 hours.  This is for the infection in the skin surrounding the eye Use Tobrex every 4 hours while awake.  Use in both eyes so the infection does not spread (it often does) See your doctor if not improving in a couple days   ED Prescriptions     Medication Sig Dispense Auth. Provider   tobramycin (TOBREX) 0.3 % ophthalmic solution Place 1 drop into both eyes every 4 (four) hours. 5 mL , MD  amoxicillin-clavulanate (AUGMENTIN) 875-125 MG tablet Take 1 tablet by mouth every 12 (twelve) hours. 14 tablet  Raylene Everts, MD      PDMP not reviewed this encounter.   Raylene Everts, MD 02/10/22 [phone]

## 2022-02-10 NOTE — Discharge Instructions (Signed)
Take the Augmentin antibiotic every 12 hours.  This is for the infection in the skin surrounding the eye Use Tobrex every 4 hours while awake.  Use in both eyes so the infection does not spread (it often does) See your doctor if not improving in a couple days

## 2022-02-19 ENCOUNTER — Encounter: Payer: Medicaid Other | Admitting: Obstetrics and Gynecology

## 2022-02-24 ENCOUNTER — Telehealth: Payer: Commercial Managed Care - PPO | Admitting: Nurse Practitioner

## 2022-02-24 DIAGNOSIS — H60502 Unspecified acute noninfective otitis externa, left ear: Secondary | ICD-10-CM

## 2022-02-24 MED ORDER — CIPROFLOXACIN-DEXAMETHASONE 0.3-0.1 % OT SUSP: 4.0000 [drp] | Freq: Two times a day (BID) | OTIC | 0 refills | Status: DC

## 2022-02-24 NOTE — Progress Notes (Signed)
Virtual Visit Consent   Sharon Stone, you are scheduled for a virtual visit with a [facility] provider today. Just as with appointments in the office, your consent must be obtained to participate. Your consent will be active for this visit and any virtual visit you may have with one of our providers in the next 365 days. If you have a MyChart account, a copy of this consent can be sent to you electronically.  As this is a virtual visit, video technology does not allow for your provider to perform a traditional examination. This may limit your provider's ability to fully assess your condition. If your provider identifies any concerns that need to be evaluated in person or the need to arrange testing (such as labs, EKG, etc.), we will make arrangements to do so. Although advances in technology are sophisticated, we cannot ensure that it will always work on either your end or our end. If the connection with a video visit is poor, the visit may have to be switched to a telephone visit. With either a video or telephone visit, we are not always able to ensure that we have a secure connection.  By engaging in this virtual visit, you consent to the provision of healthcare and authorize for your insurance to be billed (if applicable) for the services provided during this visit. Depending on your insurance coverage, you may receive a charge related to this service.  I need to obtain your verbal consent now. Are you willing to proceed with your visit today? Sharon Stone has provided verbal consent on 02/24/2022 for a virtual visit (video or telephone). Sharon Schneiders, FNP  Date: 02/24/2022 10:47 AM  Virtual Visit via Video Note   I, Sharon Stone, connected with  Sharon Stone  ([id number], 1990/07/17) on 02/24/22 at 10:30 AM EST by a video-enabled telemedicine application and verified that I am speaking with the correct person using two identifiers.  Location: Patient: Virtual Visit Location  Patient: Other: Work Provider: Ecologist: Home Office   I discussed the limitations of evaluation and management by telemedicine and the availability of in person appointments. The patient expressed understanding and agreed to proceed.    History of Present Illness: Sharon Stone is a 32 y.o. who identifies as a female who was assigned female at birth, and is being seen today for left ear pain. This onset 3 days ago Following right eye infection  She just finished Augmentin Has had minimal runny nose nothing out of the ordinary.  Denies sinus pressure or congestion  Believes ear pain started by getting water stuck in her ear  Denies history of ear surgery or tubes   Denies fever or other symptoms at this time    Problems:  Patient Active Problem List   Diagnosis Date Noted   Marijuana abuse 05/31/2016   History of VBAC 05/31/2016   Gestational thrombocytopenia ([place]) 05/31/2016   Gestational hypertension 05/31/2016   Fetal demise, greater than 22 weeks, antepartum 05/28/2016   Tobacco smoking affecting pregnancy    Maternal morbid obesity, antepartum ([place])     Allergies:  Allergies  Allergen Reactions   Shellfish Allergy Anaphylaxis   Medications:  Current Outpatient Medications:    acetaminophen (TYLENOL) 500 MG tablet, Take 1,000 mg by mouth every 6 (six) hours as needed for headache., Disp: , Rfl:    amoxicillin-clavulanate (AUGMENTIN) 875-125 MG tablet, Take 1 tablet by mouth every 12 (twelve) hours., Disp: 14 tablet, Rfl: 0   EPINEPHrine (EPIPEN 2-PAK) 0.3 mg/0.3  mL IJ SOAJ injection, Inject 0.3 mg into the muscle once., Disp: , Rfl:    ibuprofen (ADVIL,MOTRIN) 600 MG tablet, Take 1 tablet (600 mg total) by mouth every 6 (six) hours as needed., Disp: 30 tablet, Rfl: 0   tobramycin (TOBREX) 0.3 % ophthalmic solution, Place 1 drop into both eyes every 4 (four) hours., Disp: 5 mL, Rfl: 0  Observations/Objective: Patient is well-developed,  well-nourished in no acute distress.  Resting comfortably  at home.  Head is normocephalic, atraumatic.  No labored breathing.  Speech is clear and coherent with logical content.  Patient is alert and oriented at baseline.    Assessment and Plan: 1. Acute otitis externa of left ear, unspecified type Advised patient to start with Debrox May use ibuprofen for pain relief  After Debrox use assure ear is dry and start antibiotic drops as discussed  - ciprofloxacin-dexamethasone (CIPRODEX) OTIC suspension; Place 4 drops into the left ear 2 (two) times daily for 10 days.  Dispense: 7.5 mL; Refill: 0     Follow Up Instructions: I discussed the assessment and treatment plan with the patient. The patient was provided an opportunity to ask questions and all were answered. The patient agreed with the plan and demonstrated an understanding of the instructions.  A copy of instructions were sent to the patient via MyChart unless otherwise noted below.    The patient was advised to call back or seek an in-person evaluation if the symptoms worsen or if the condition fails to improve as anticipated.  Time:  I spent 10 minutes with the patient via telehealth technology discussing the above problems/concerns.    Sharon Schneiders, FNP

## 2022-02-25 ENCOUNTER — Ambulatory Visit (INDEPENDENT_AMBULATORY_CARE_PROVIDER_SITE_OTHER): Payer: Commercial Managed Care - PPO | Admitting: Physician Assistant

## 2022-02-25 ENCOUNTER — Encounter: Payer: Self-pay | Admitting: Physician Assistant

## 2022-02-25 VITALS — BP 124/76 | HR 76 | Temp 97.8°F | Ht 65.0 in | Wt 347.0 lb

## 2022-02-25 DIAGNOSIS — K219 Gastro-esophageal reflux disease without esophagitis: Secondary | ICD-10-CM | POA: Diagnosis not present

## 2022-02-25 DIAGNOSIS — F17210 Nicotine dependence, cigarettes, uncomplicated: Secondary | ICD-10-CM

## 2022-02-25 DIAGNOSIS — Z113 Encounter for screening for infections with a predominantly sexual mode of transmission: Secondary | ICD-10-CM | POA: Diagnosis not present

## 2022-02-25 DIAGNOSIS — Z124 Encounter for screening for malignant neoplasm of cervix: Secondary | ICD-10-CM | POA: Diagnosis not present

## 2022-02-25 LAB — HM PAP SMEAR: HM Pap smear: NEGATIVE

## 2022-02-25 MED ORDER — PANTOPRAZOLE SODIUM 20 MG PO TBEC: 20.0000 mg | DELAYED_RELEASE_TABLET | Freq: Every day | ORAL | 2 refills | Status: DC

## 2022-02-25 NOTE — Progress Notes (Unsigned)
Subjective:    Patient ID: Sharon Stone, female    DOB: 11-20-1990, 32 y.o.   MRN: [id number]  Chief Complaint  Patient presents with   New Patient (Initial Visit)    New pt in office to establish carre with PCP; pt is due for CPE and fasting labs; pt having really bad heart burn; pt wanting help with weight loss;     HPI 32 y.o. patient presents today for new patient establishment with me.  Patient hasn't had PCP in awhile. 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 5 & 57 yo kids at home.   Current Care Team: [place]   Acute Concerns: GERD - 5/7 days per week. Randomly wakes up with it. Worse after certain foods. Tums helps as needed. Smokes Black and Milds about 2 per day, started at age 33.    Chronic Concerns: See PMH listed below, as well as A/P for details on issues we specifically discussed during today's visit.      Past Medical History:  Diagnosis Date   Allergy    Anxiety    Asthma    Depression    GERD (gastroesophageal reflux disease)     Past Surgical History:  Procedure Laterality Date   CESAREAN SECTION N/A 12/02/2014   Procedure: CESAREAN SECTION;  Surgeon: Jonnie Kind, MD;  Location: [facility];  Service: Obstetrics;  Laterality: N/A;   WISDOM TOOTH EXTRACTION  2010    Family History  Problem Relation Age of Onset   Cancer Mother    Heart disease Maternal Grandmother    Diabetes Maternal Grandmother    Breast cancer Maternal Grandmother    Diabetes Maternal Aunt     Social History   Tobacco Use   Smoking status: Every Day    Packs/day: 0.25    Types: Cigarettes   Smokeless tobacco: Current  Vaping Use   Vaping Use: Never used  Substance Use Topics   Alcohol use: Yes   Drug use: Yes    Types: Marijuana    Comment: pt describes herself as "pothead"     Allergies  Allergen Reactions   Shellfish Allergy Anaphylaxis    Review of Systems NEGATIVE UNLESS OTHERWISE INDICATED IN HPI      Objective:     BP 124/76  (BP Location: Left Arm)   Pulse 76   Temp 97.8 F (36.6 C) (Temporal)   Ht 5\' 5"  (1.651 m)   Wt (!) 347 lb (157.4 kg)   LMP 02/25/2022 (Exact Date)   SpO2 98%   BMI 57.74 kg/m   Wt Readings from Last 3 Encounters:  02/25/22 (!) 347 lb (157.4 kg)  02/10/22 (!) 350 lb (158.8 kg)  08/19/21 (!) 342 lb (155.1 kg)    BP Readings from Last 3 Encounters:  02/25/22 124/76  02/10/22 123/81  08/19/21 130/88     Physical Exam Vitals and nursing note reviewed.  Constitutional:      Appearance: Normal appearance. She is morbidly obese. She is not toxic-appearing.  HENT:     Head: Normocephalic and atraumatic.     Right Ear: Tympanic membrane, ear canal and external ear normal.     Left Ear: Tympanic membrane, ear canal and external ear normal.     Nose: Nose normal.     Mouth/Throat:     Mouth: Mucous membranes are moist.  Eyes:     Extraocular Movements: Extraocular movements intact.     Conjunctiva/sclera: Conjunctivae normal.     Pupils: Pupils are  equal, round, and reactive to light.  Cardiovascular:     Rate and Rhythm: Normal rate and regular rhythm.     Pulses: Normal pulses.     Heart sounds: Normal heart sounds.  Pulmonary:     Effort: Pulmonary effort is normal.     Breath sounds: Normal breath sounds.  Abdominal:     General: Abdomen is flat. Bowel sounds are normal.     Palpations: Abdomen is soft.  Musculoskeletal:        General: Normal range of motion.     Cervical back: Normal range of motion and neck supple.  Skin:    General: Skin is warm and dry.  Neurological:     General: No focal deficit present.     Mental Status: She is alert and oriented to person, place, and time.  Psychiatric:        Mood and Affect: Mood normal.        Behavior: Behavior normal.        Thought Content: Thought content normal.        Judgment: Judgment normal.        Assessment & Plan:  Gastroesophageal reflux disease without esophagitis Assessment & Plan: Plan to start  on Protonix 20 mg once daily.  AVS provided with additional information.   Morbid obesity due to excess calories [facility]) Assessment & Plan: Referral to healthy weight and wellness.  Encouraged her to work on nutritional and exercise changes.  Orders: -     Amb Ref to Medical Weight Management  Cigarette nicotine dependence without complication Assessment & Plan: Patient is working on smoking cessation.   Other orders -     Pantoprazole Sodium; Take 1 tablet (20 mg total) by mouth daily.  Dispense: 30 tablet; Refill: 2       Return in about 5 months (around 07/27/2022) for CPE and fasting labs .  This note was prepared with assistance of Systems analyst. Occasional wrong-word or sound-a-like substitutions may have occurred due to the inherent limitations of voice recognition software.     Maykel Reitter M Wadell Craddock, PA-C

## 2022-02-25 NOTE — Patient Instructions (Signed)
[email or web address]   Welcome to [employer] at [facility]! It was a pleasure meeting you today.  As discussed, Please schedule a 5 month follow up visit today.  PLEASE NOTE:  If you had any LAB tests please let us know if you have not heard back within a few days. You may see your results on MyChart before we have a chance to review them but we will give you a call once they are reviewed by [place]. If we ordered any REFERRALS today, please let us know if you have not heard from their office within the next two weeks. Let us know through MyChart if you are needing REFILLS, or have your pharmacy send [place] the request. You can also use MyChart to communicate with me or any office staff.  Please try these tips to maintain a healthy lifestyle:  Eat most of your calories during the day when you are active. Eliminate processed foods including packaged sweets (pies, cakes, cookies), reduce intake of potatoes, white bread, white pasta, and white rice. Look for whole grain options, oat flour or almond flour.  Each meal should contain half fruits/vegetables, one quarter protein, and one quarter carbs (no bigger than a computer mouse).  Cut down on sweet beverages. This includes juice, soda, and sweet tea. Also watch fruit intake, though this is a healthier sweet option, it still contains natural sugar! Limit to 3 servings daily.  Drink at least 1 glass of water with each meal and aim for at least 8 glasses (64 ounces) per day.  Exercise at least 150 minutes every week to the best of your ability.    Take Care,  Khamiya Varin, PA-C

## 2022-02-26 NOTE — Assessment & Plan Note (Signed)
Referral to healthy weight and wellness.  Encouraged her to work on nutritional and exercise changes.

## 2022-02-26 NOTE — Assessment & Plan Note (Signed)
Patient is working on smoking cessation 

## 2022-02-26 NOTE — Assessment & Plan Note (Signed)
Plan to start on Protonix 20 mg once daily.  AVS provided with additional information.

## 2022-03-20 ENCOUNTER — Encounter: Payer: Commercial Managed Care - PPO | Admitting: Nurse Practitioner

## 2022-03-20 DIAGNOSIS — D259 Leiomyoma of uterus, unspecified: Secondary | ICD-10-CM | POA: Diagnosis not present

## 2022-03-20 DIAGNOSIS — N946 Dysmenorrhea, unspecified: Secondary | ICD-10-CM | POA: Diagnosis not present

## 2022-03-20 DIAGNOSIS — N920 Excessive and frequent menstruation with regular cycle: Secondary | ICD-10-CM | POA: Diagnosis not present

## 2022-03-21 DIAGNOSIS — D259 Leiomyoma of uterus, unspecified: Secondary | ICD-10-CM | POA: Insufficient documentation

## 2022-03-23 ENCOUNTER — Other Ambulatory Visit: Payer: Self-pay | Admitting: Physician Assistant

## 2022-03-24 MED ORDER — EPINEPHRINE 0.3 MG/0.3ML IJ SOAJ: 0.3000 mg | Freq: Once | INTRAMUSCULAR | 0 refills | Status: AC

## 2022-03-26 ENCOUNTER — Encounter: Payer: Commercial Managed Care - PPO | Admitting: Nurse Practitioner

## 2022-03-31 ENCOUNTER — Encounter: Payer: Self-pay | Admitting: Nurse Practitioner

## 2022-03-31 ENCOUNTER — Ambulatory Visit (INDEPENDENT_AMBULATORY_CARE_PROVIDER_SITE_OTHER): Payer: Commercial Managed Care - PPO | Admitting: Nurse Practitioner

## 2022-03-31 VITALS — BP 134/82 | HR 82 | Temp 98.2°F | Ht 65.0 in | Wt 350.0 lb

## 2022-03-31 DIAGNOSIS — F17210 Nicotine dependence, cigarettes, uncomplicated: Secondary | ICD-10-CM

## 2022-03-31 DIAGNOSIS — Z0289 Encounter for other administrative examinations: Secondary | ICD-10-CM

## 2022-03-31 DIAGNOSIS — Z6841 Body Mass Index (BMI) 40.0 and over, adult: Secondary | ICD-10-CM

## 2022-03-31 DIAGNOSIS — Z720 Tobacco use: Secondary | ICD-10-CM

## 2022-03-31 NOTE — Progress Notes (Signed)
Office: [phone]  /  Fax: [phone]   Initial Visit  Sharon Stone was seen in clinic today to evaluate for obesity. She is interested in losing weight to improve overall health and reduce the risk of weight related complications. She presents today to review program treatment options, initial physical assessment, and evaluation.     She was referred by: PCP  When asked what else they would like to accomplish? She states: Adopt healthier eating patterns, Improve energy levels and physical activity, Improve existing medical conditions, Reduce number of medications, Reduce risk for a surgery, Improve quality of life, Improve appearance, Improve self-confidence, and Lose a target amount of weight : under <300 lbs  Weight history:  She started gaining weight after having her daughter at age 17.   When asked how has your weight affected you? She states: Has affected self-esteem, Having fatigue, Problems with eating patterns, and Problems with depression and or anxiety  Some associated conditions: Hypertension and GERD  Contributing factors: Family history, Stress, and Pregnancy  Weight promoting medications identified: Steroids and Contraceptives or hormonal therapy  Current nutrition plan: None  Current level of physical activity: None  Current or previous pharmacotherapy: None  Response to medication: Never tried medications   Past medical history includes:   Past Medical History:  Diagnosis Date   Allergy    Anxiety    Asthma    Depression    GERD (gastroesophageal reflux disease)      Objective:   BP 134/82   Pulse 82   Temp 98.2 F (36.8 C)   Ht '5\' 5"'$  (1.651 m)   Wt (!) 350 lb (158.8 kg)   LMP 03/28/2022 (Exact Date)   SpO2 97%   BMI 58.24 kg/m  She was weighed on the bioimpedance scale: Body mass index is 58.24 kg/m.  Peak Weight:350 lbs , Body Fat%:60, Visceral Fat Rating:23, Weight trend over the last 12 months: Increasing  General:  Alert,  oriented and cooperative. Patient is in no acute distress.  Respiratory: Normal respiratory effort, no problems with respiration noted   Gait: able to ambulate independently  Mental Status: Normal mood and affect. Normal behavior. Normal judgment and thought content.   DIAGNOSTIC DATA REVIEWED:  BMET    Component Value Date/Time   NA 136 05/29/2016 0323   K 3.6 05/29/2016 0323   CL 107 05/29/2016 0323   CO2 23 05/29/2016 0323   GLUCOSE 105 (H) 05/29/2016 0323   BUN 8 05/29/2016 0323   CREATININE 0.54 05/29/2016 0323   CALCIUM 9.0 05/29/2016 0323   GFRNONAA >60 05/29/2016 0323   GFRAA >60 05/29/2016 0323   No results found for: "HGBA1C" No results found for: "INSULIN" CBC    Component Value Date/Time   WBC 10.1 05/29/2016 1156   RBC 3.81 (L) 05/29/2016 1156   HGB 9.7 (L) 05/29/2016 1156   HCT 29.4 (L) 05/29/2016 1156   PLT 128 (L) 05/29/2016 1156   MCV 77.2 (L) 05/29/2016 1156   MCH 25.5 (L) 05/29/2016 1156   MCHC 33.0 05/29/2016 1156   RDW 16.1 (H) 05/29/2016 1156   Iron/TIBC/Ferritin/ %Sat No results found for: "IRON", "TIBC", "FERRITIN", "IRONPCTSAT" Lipid Panel  No results found for: "CHOL", "TRIG", "HDL", "CHOLHDL", "VLDL", "LDLCALC", "LDLDIRECT" Hepatic Function Panel     Component Value Date/Time   PROT 7.3 05/29/2016 0323   ALBUMIN 2.8 (L) 05/29/2016 0323   AST 15 05/29/2016 0323   ALT 13 (L) 05/29/2016 0323   ALKPHOS 77 05/29/2016 0323   BILITOT  0.2 (L) 05/29/2016 0323      Component Value Date/Time   TSH 2.118 05/30/2016 0949     Assessment and Plan:   Tobacco abuse Patient is taking Wellbutrin and feels that has been beneficial by reducing her smoking. Denies side effects.  She is still working on smoking cessation.    Morbid obesity ([place])  BMI 50.0-59.9, adult ([place])        Obesity Treatment / Action Plan:  Patient will work on garnering support from family and friends to begin weight loss journey. Will work on eliminating or reducing  the presence of highly palatable, calorie dense foods in the home. Will complete provided nutritional and psychosocial assessment questionnaire before the next appointment. Will be scheduled for indirect calorimetry to determine resting energy expenditure in a fasting state.  This will allow [place] to create a reduced calorie, high-protein meal plan to promote loss of fat mass while preserving muscle mass.  Obesity Education Performed Today:  She was weighed on the bioimpedance scale and results were discussed and documented in the synopsis.  We discussed obesity as a disease and the importance of a more detailed evaluation of all the factors contributing to the disease.  We discussed the importance of long term lifestyle changes which include nutrition, exercise and behavioral modifications as well as the importance of customizing this to her specific health and social needs.  We discussed the benefits of reaching a healthier weight to alleviate the symptoms of existing conditions and reduce the risks of the biomechanical, metabolic and psychological effects of obesity.  Ivey Heraty appears to be in the action stage of change and states they are ready to start intensive lifestyle modifications and behavioral modifications.  30 minutes was spent today on this visit including the above counseling, pre-visit chart review, and post-visit documentation.  Reviewed by clinician on day of visit: allergies, medications, problem list, medical history, surgical history, family history, social history, and previous encounter notes pertinent to obesity diagnosis.  Everardo Pacific, FNP

## 2022-04-15 DIAGNOSIS — H53453 Other localized visual field defect, bilateral: Secondary | ICD-10-CM | POA: Diagnosis not present

## 2022-04-15 DIAGNOSIS — Z9189 Other specified personal risk factors, not elsewhere classified: Secondary | ICD-10-CM | POA: Diagnosis not present

## 2022-04-15 DIAGNOSIS — R519 Headache, unspecified: Secondary | ICD-10-CM | POA: Diagnosis not present

## 2022-04-15 DIAGNOSIS — H47293 Other optic atrophy, bilateral: Secondary | ICD-10-CM | POA: Diagnosis not present

## 2022-04-15 DIAGNOSIS — H4711 Papilledema associated with increased intracranial pressure: Secondary | ICD-10-CM | POA: Diagnosis not present

## 2022-04-16 ENCOUNTER — Encounter: Payer: Self-pay | Admitting: Physician Assistant

## 2022-04-17 NOTE — Telephone Encounter (Signed)
Please see pt msg and advise 

## 2022-04-18 ENCOUNTER — Telehealth: Payer: Self-pay | Admitting: Physician Assistant

## 2022-04-18 DIAGNOSIS — E639 Nutritional deficiency, unspecified: Secondary | ICD-10-CM | POA: Diagnosis not present

## 2022-04-18 DIAGNOSIS — D649 Anemia, unspecified: Secondary | ICD-10-CM | POA: Diagnosis not present

## 2022-04-18 NOTE — Telephone Encounter (Signed)
Vml for pt to call back and sch ov with PCP soon . To be seen on same day apt on 3/26 or 4/1  Reason: Low b12/vit D

## 2022-04-18 NOTE — Telephone Encounter (Signed)
Patient is scheduled for OV 04/28/22 at 2pm

## 2022-04-21 ENCOUNTER — Other Ambulatory Visit: Payer: Self-pay | Admitting: Physician Assistant

## 2022-04-21 MED ORDER — VITAMIN D (ERGOCALCIFEROL) 1.25 MG (50000 UNIT) PO CAPS: 50000.0000 [IU] | ORAL_CAPSULE | ORAL | 0 refills | Status: DC

## 2022-04-22 ENCOUNTER — Telehealth: Payer: Self-pay | Admitting: Physician Assistant

## 2022-04-22 NOTE — Telephone Encounter (Signed)
Returned pt call and advised, Vitamin D Rx sent to pharmacy, B-12 injections starting here in the office this week along with oral B-12 and Folate supplements OTC. Pt scheduled to come back and see PCP.

## 2022-04-22 NOTE — Telephone Encounter (Signed)
Patient returning call from [place] , requesting call back at earliest convenience to [phone]

## 2022-04-23 ENCOUNTER — Ambulatory Visit (INDEPENDENT_AMBULATORY_CARE_PROVIDER_SITE_OTHER): Payer: Commercial Managed Care - PPO

## 2022-04-23 ENCOUNTER — Encounter: Payer: Self-pay | Admitting: Bariatrics

## 2022-04-23 ENCOUNTER — Ambulatory Visit (INDEPENDENT_AMBULATORY_CARE_PROVIDER_SITE_OTHER): Payer: Commercial Managed Care - PPO | Admitting: Bariatrics

## 2022-04-23 VITALS — BP 130/82 | HR 82 | Temp 97.9°F | Ht 65.0 in | Wt 342.0 lb

## 2022-04-23 DIAGNOSIS — R5383 Other fatigue: Secondary | ICD-10-CM | POA: Diagnosis not present

## 2022-04-23 DIAGNOSIS — R0602 Shortness of breath: Secondary | ICD-10-CM | POA: Diagnosis not present

## 2022-04-23 DIAGNOSIS — E538 Deficiency of other specified B group vitamins: Secondary | ICD-10-CM

## 2022-04-23 DIAGNOSIS — Z6841 Body Mass Index (BMI) 40.0 and over, adult: Secondary | ICD-10-CM | POA: Diagnosis not present

## 2022-04-23 DIAGNOSIS — Z1331 Encounter for screening for depression: Secondary | ICD-10-CM | POA: Diagnosis not present

## 2022-04-23 DIAGNOSIS — E559 Vitamin D deficiency, unspecified: Secondary | ICD-10-CM | POA: Diagnosis not present

## 2022-04-23 DIAGNOSIS — K219 Gastro-esophageal reflux disease without esophagitis: Secondary | ICD-10-CM | POA: Diagnosis not present

## 2022-04-23 DIAGNOSIS — R7309 Other abnormal glucose: Secondary | ICD-10-CM

## 2022-04-23 DIAGNOSIS — Z0000 Encounter for general adult medical examination without abnormal findings: Secondary | ICD-10-CM

## 2022-04-23 HISTORY — DX: Encounter for general adult medical examination without abnormal findings: Z00.00

## 2022-04-23 MED ORDER — CYANOCOBALAMIN 1000 MCG/ML IJ SOLN
1000.0000 ug | Freq: Once | INTRAMUSCULAR | Status: AC
  Administered 2022-04-23: 1000 ug via INTRAMUSCULAR

## 2022-04-23 MED ORDER — B-12 COMPLIANCE INJECTION 1000 MCG/ML IJ KIT: 1000.0000 ug | PACK | INTRAMUSCULAR | 1 refills | Status: DC

## 2022-04-23 NOTE — Progress Notes (Signed)
Pt in office for nurse visit due to vitamin B-12 deficiency. Administered 1000 mcg/mL Cyanocobalamin injection to patient in right deltoid and patient tolerated well with no issues. Also discussed Vitamin B-12 protocol along with OTC supplements she will be taking during this process. Also advised on Vitamin D Rx that was sent in and instructions on how to properly take this prescription. Pt verbalized understanding and proceeded to checkout to scheduling remaining appts for B-12 injections per PCP recommendations.

## 2022-04-24 ENCOUNTER — Encounter (INDEPENDENT_AMBULATORY_CARE_PROVIDER_SITE_OTHER): Payer: Self-pay | Admitting: Bariatrics

## 2022-04-24 DIAGNOSIS — E88819 Insulin resistance, unspecified: Secondary | ICD-10-CM | POA: Insufficient documentation

## 2022-04-24 LAB — INSULIN, RANDOM: INSULIN: 19.7 u[IU]/mL (ref 2.6–24.9)

## 2022-04-25 DIAGNOSIS — H4710 Unspecified papilledema: Secondary | ICD-10-CM | POA: Diagnosis not present

## 2022-04-25 DIAGNOSIS — H47293 Other optic atrophy, bilateral: Secondary | ICD-10-CM | POA: Diagnosis not present

## 2022-04-25 DIAGNOSIS — R519 Headache, unspecified: Secondary | ICD-10-CM | POA: Diagnosis not present

## 2022-04-25 DIAGNOSIS — H4711 Papilledema associated with increased intracranial pressure: Secondary | ICD-10-CM | POA: Diagnosis not present

## 2022-04-25 DIAGNOSIS — H53453 Other localized visual field defect, bilateral: Secondary | ICD-10-CM | POA: Diagnosis not present

## 2022-04-28 ENCOUNTER — Ambulatory Visit: Payer: Commercial Managed Care - PPO | Admitting: Physician Assistant

## 2022-04-28 NOTE — Progress Notes (Unsigned)
Chief Complaint:   OBESITY Sharon Stone (MR# [id number]) is a 32 y.o. female who presents for evaluation and treatment of obesity and related comorbidities. Current BMI is Body mass index is 56.91 kg/m. Sharon Stone has been struggling with her weight for many years and has been unsuccessful in either losing weight, maintaining weight loss, or reaching her healthy weight goal.  Patient saw Sharon Pacific, FNP on 03/31/2022 for an information session.   Sharon Stone is currently in the action stage of change and ready to dedicate time achieving and maintaining a healthier weight. Sharon Stone is interested in becoming our patient and working on intensive lifestyle modifications including (but not limited to) diet and exercise for weight loss.  Sharon Stone's habits were reviewed today and are as follows: Her family eats meals together, she thinks her family will eat healthier with her, her desired weight loss is 52 lbs, she has been heavy most of her life, she started gaining weight after having children, her heaviest weight ever was 350 pounds, she is a picky eater and doesn't like to eat healthier foods, she has significant food cravings issues, she snacks frequently in the evenings, she skips meals frequently, she is frequently drinking liquids with calories, she frequently makes poor food choices, she frequently eats larger portions than normal, and she struggles with emotional eating.  Depression Screen Sharon Stone's Food and Mood (modified PHQ-9) score was 17.  Subjective:   1. Other fatigue Sharon Stone admits to daytime somnolence and admits to waking up still tired. Patient has a history of symptoms of daytime fatigue and morning fatigue. Sharon Stone generally gets 6 or 7 hours of sleep per night, and states that she has nightime awakenings. Snoring is present. Apneic episodes are not present. Epworth Sleepiness Score is 7.   2. SOB (shortness of breath) on exertion Sharon Stone notes increasing shortness  of breath with exercising and seems to be worsening over time with weight gain. She notes getting out of breath sooner with activity than she used to. This has not gotten worse recently. Sharon Stone denies shortness of breath at rest or orthopnea.  3. Gastroesophageal reflux disease without esophagitis Patient is not taking any medications.   4. Vitamin D deficiency Patient is taking high dose Vitamin D.  Decreased down to 3.   5. Elevated glucose Patient is not taking any medications.  Last A1c was 5.5, Glucose 108.  6. Health care maintenance Obesity.   7. Vitamin B 12 deficiency Last B12 was 139.  Assessment/Plan:   1. Other fatigue Sharon Stone does feel that her weight is causing her energy to be lower than it should be. Fatigue may be related to obesity, depression or many other causes. Labs will be ordered, and in the meanwhile, Sharon Stone will focus on self care including making healthy food choices, increasing physical activity and focusing on stress reduction.  - EKG 12-Lead  2. SOB (shortness of breath) on exertion Sharon Stone does feel that she gets out of breath more easily that she used to when she exercises. Sharon Stone's shortness of breath appears to be obesity related and exercise induced. She has agreed to work on weight loss and gradually increase exercise to treat her exercise induced shortness of breath. Will continue to monitor closely.  3. Gastroesophageal reflux disease without esophagitis Patient will work on controlling her triggers.   4. Vitamin D deficiency Continue high dose Vitamin D.   5. Elevated glucose Check labs today.   - Insulin, random  6. Health care maintenance Check labs, EKG,  and IC today.   7. Vitamin B 12 deficiency She will talk to PCP tomorrow.   8. Depression screening Sharon Stone had a positive depression screening. Depression is commonly associated with obesity and often results in emotional eating behaviors. We will monitor this closely and work  on CBT to help improve the non-hunger eating patterns. Referral to Psychology may be required if no improvement is seen as she continues in our clinic.  9. Morbid obesity ([place])  10. BMI 50.0-59.9, adult [facility]) Sharon Stone is currently in the action stage of change and her goal is to continue with weight loss efforts. I recommend Sharon Stone begin the structured treatment plan as follows:  She has agreed to the Category 3 Plan.  Meal planning.  Intentional eating.  Labs reviewed from 04/15/2022.  Vitamin A, homocysteine, methylmalonic acid, CMP, CMP, Hepatitis C, iron/anemia panel, thyroid profile, blood glucose, 04/17/17. Try not to skip meals.  Exercise goals: All adults should avoid inactivity. Some physical activity is better than none, and adults who participate in any amount of physical activity gain some health benefits.   Behavioral modification strategies: increasing lean protein intake, decreasing simple carbohydrates, increasing vegetables, increasing water intake, decreasing eating out, no skipping meals, meal planning and cooking strategies, keeping healthy foods in the home, and planning for success.  She was informed of the importance of frequent follow-up visits to maximize her success with intensive lifestyle modifications for her multiple health conditions. She was informed we would discuss her lab results at her next visit unless there is a critical issue that needs to be addressed sooner. Sharon Stone agreed to keep her next visit at the agreed upon time to discuss these results.  Objective:   Blood pressure 130/82, pulse 82, temperature 97.9 F (36.6 C), height 5\' 5"  (1.651 m), weight (!) 342 lb (155.1 kg), last menstrual period 03/28/2022, SpO2 97 %. Body mass index is 56.91 kg/m.  EKG: Normal sinus rhythm, rate 88 bpm.  Indirect Calorimeter completed today shows a VO2 of 326 and a REE of 2246.  Her calculated basal metabolic rate is 0000000 thus her basal metabolic rate is worse than  expected.  General: Cooperative, alert, well developed, in no acute distress. HEENT: Conjunctivae and lids unremarkable. Cardiovascular: Regular rhythm.  Lungs: Normal work of breathing. Neurologic: No focal deficits.   Lab Results  Component Value Date   CREATININE 0.54 05/29/2016   BUN 8 05/29/2016   NA 136 05/29/2016   K 3.6 05/29/2016   CL 107 05/29/2016   CO2 23 05/29/2016   Lab Results  Component Value Date   ALT 13 (L) 05/29/2016   AST 15 05/29/2016   ALKPHOS 77 05/29/2016   BILITOT 0.2 (L) 05/29/2016   No results found for: "HGBA1C" Lab Results  Component Value Date   INSULIN 19.7 04/23/2022   Lab Results  Component Value Date   TSH 2.118 05/30/2016   No results found for: "CHOL", "HDL", "LDLCALC", "LDLDIRECT", "TRIG", "CHOLHDL" Lab Results  Component Value Date   WBC 10.1 05/29/2016   HGB 9.7 (L) 05/29/2016   HCT 29.4 (L) 05/29/2016   MCV 77.2 (L) 05/29/2016   PLT 128 (L) 05/29/2016   No results found for: "IRON", "TIBC", "FERRITIN"  Attestation Statements:   Reviewed by clinician on day of visit: allergies, medications, problem list, medical history, surgical history, family history, social history, and previous encounter notes.  Delfina Redwood, am acting as Location manager for [employer], DO.  I have reviewed the above documentation for accuracy and  completeness, and I agree with the above. Jearld Lesch, DO

## 2022-04-30 ENCOUNTER — Encounter: Payer: Self-pay | Admitting: Bariatrics

## 2022-04-30 ENCOUNTER — Ambulatory Visit: Payer: Commercial Managed Care - PPO

## 2022-05-01 ENCOUNTER — Ambulatory Visit (INDEPENDENT_AMBULATORY_CARE_PROVIDER_SITE_OTHER): Payer: Commercial Managed Care - PPO

## 2022-05-01 DIAGNOSIS — E538 Deficiency of other specified B group vitamins: Secondary | ICD-10-CM

## 2022-05-01 MED ORDER — CYANOCOBALAMIN 1000 MCG/ML IJ SOLN
1000.0000 ug | Freq: Once | INTRAMUSCULAR | Status: AC
  Administered 2022-05-01: 1000 ug via INTRAMUSCULAR

## 2022-05-01 NOTE — Progress Notes (Signed)
Sharon Stone 32 yr old female presents to office today for 2nd of 4 weekly B12 injections per Theresa Duty, PA. Administered CYANCOBALAMIN 1,000 mcg IM left arm. Patient tolerated well. Aware to return next week for 3rd injection.

## 2022-05-07 ENCOUNTER — Encounter: Payer: Self-pay | Admitting: Bariatrics

## 2022-05-07 ENCOUNTER — Ambulatory Visit (INDEPENDENT_AMBULATORY_CARE_PROVIDER_SITE_OTHER): Payer: Commercial Managed Care - PPO | Admitting: Bariatrics

## 2022-05-07 VITALS — BP 122/76 | HR 83 | Temp 97.8°F | Ht 65.0 in | Wt 343.0 lb

## 2022-05-07 DIAGNOSIS — Z6841 Body Mass Index (BMI) 40.0 and over, adult: Secondary | ICD-10-CM | POA: Diagnosis not present

## 2022-05-07 DIAGNOSIS — E88819 Insulin resistance, unspecified: Secondary | ICD-10-CM

## 2022-05-07 DIAGNOSIS — E559 Vitamin D deficiency, unspecified: Secondary | ICD-10-CM

## 2022-05-07 NOTE — Progress Notes (Signed)
Chief Complaint:   OBESITY Sharon Stone is here to discuss her progress with her obesity treatment plan along with follow-up of her obesity related diagnoses. [name] is on the Category 3 Plan and states she is following her eating plan approximately 30% of the time. [name] states she is walking for 20 minutes 2 times per week.  Today's visit was #: 2 Starting weight: 342 lbs Starting date: 04/23/2022 Today's weight: 343 lbs Today's date: 05/07/2022 Total lbs lost to date: 0 Total lbs lost since last in-office visit: 0  Interim History: Sharon Stone is up 1 pound since her last visit.  She has had some struggles.  She is drinking more water and eating breakfast.    Subjective:   1. Insulin resistance [name]'s recent insulin level was 19.7.  2. Vitamin D deficiency [name] is taking prescription Vitamin D.   Assessment/Plan:   1. Insulin resistance Handouts on insulin resistance and prediabetes were given to the patient and discussed.  2. Vitamin D deficiency [name] will continue prescription vitamin D and B12 injections.  We will recheck her vitamin D level in the future.  3. Morbid obesity  4. BMI 50.0-59.9, adult Sharon Stone is currently in the action stage of change. As such, her goal is to continue with weight loss efforts. She has agreed to keeping a food journal and adhering to recommended goals of 1500 calories and 90 grams protein.   Meal planning was discussed.  Review labs with the patient from 04/20/2022, insulin; and earlier labs, vitamin D, and A1c.  She will not skip meals, and she will use tracker on her phone.  She will take her lunch, and we will keep her water intake high.  Dining Out Guide, Recipes II, and [employer] were given.   Exercise goals: As is.   Behavioral modification strategies: increasing lean protein intake, decreasing simple carbohydrates, increasing vegetables, increasing water intake, decreasing eating out, no skipping meals, meal  planning and cooking strategies, keeping healthy foods in the home, and planning for success.  Sharon Stone has agreed to follow-up with our clinic in 2 weeks. She was informed of the importance of frequent follow-up visits to maximize her success with intensive lifestyle modifications for her multiple health conditions.   Objective:   Blood pressure 122/76, pulse 83, temperature 97.8 F (36.6 C), height 5\' 5"  (1.651 m), weight (!) 343 lb (155.6 kg), SpO2 97 %. Body mass index is 57.08 kg/m.  General: Cooperative, alert, well developed, in no acute distress. HEENT: Conjunctivae and lids unremarkable. Cardiovascular: Regular rhythm.  Lungs: Normal work of breathing. Neurologic: No focal deficits.   Lab Results  Component Value Date   CREATININE 0.54 05/29/2016   BUN 8 05/29/2016   NA 136 05/29/2016   K 3.6 05/29/2016   CL 107 05/29/2016   CO2 23 05/29/2016   Lab Results  Component Value Date   ALT 13 (L) 05/29/2016   AST 15 05/29/2016   ALKPHOS 77 05/29/2016   BILITOT 0.2 (L) 05/29/2016   No results found for: "HGBA1C" Lab Results  Component Value Date   INSULIN 19.7 04/23/2022   Lab Results  Component Value Date   TSH 2.118 05/30/2016   No results found for: "CHOL", "HDL", "LDLCALC", "LDLDIRECT", "TRIG", "CHOLHDL" No results found for: "VD25OH" Lab Results  Component Value Date   WBC 10.1 05/29/2016   HGB 9.7 (L) 05/29/2016   HCT 29.4 (L) 05/29/2016   MCV 77.2 (L) 05/29/2016   PLT 128 (L) 05/29/2016   No  results found for: "IRON", "TIBC", "FERRITIN"  Attestation Statements:   Reviewed by clinician on day of visit: allergies, medications, problem list, medical history, surgical history, family history, social history, and previous encounter notes.  Trude McburneyI, Sharon Martin, am acting as Energy managertranscriptionist for [employer] Jashan Cotten, DO.  I have reviewed the above documentation for accuracy and completeness, and I agree with the above. Corinna Capra-  Ubaldo Daywalt, DO

## 2022-05-08 ENCOUNTER — Ambulatory Visit (INDEPENDENT_AMBULATORY_CARE_PROVIDER_SITE_OTHER): Payer: Commercial Managed Care - PPO

## 2022-05-08 DIAGNOSIS — H4711 Papilledema associated with increased intracranial pressure: Secondary | ICD-10-CM | POA: Diagnosis not present

## 2022-05-08 DIAGNOSIS — R519 Headache, unspecified: Secondary | ICD-10-CM | POA: Diagnosis not present

## 2022-05-08 DIAGNOSIS — H47293 Other optic atrophy, bilateral: Secondary | ICD-10-CM | POA: Diagnosis not present

## 2022-05-08 DIAGNOSIS — E538 Deficiency of other specified B group vitamins: Secondary | ICD-10-CM | POA: Diagnosis not present

## 2022-05-08 DIAGNOSIS — Z9189 Other specified personal risk factors, not elsewhere classified: Secondary | ICD-10-CM | POA: Diagnosis not present

## 2022-05-08 DIAGNOSIS — H53453 Other localized visual field defect, bilateral: Secondary | ICD-10-CM | POA: Diagnosis not present

## 2022-05-08 MED ORDER — CYANOCOBALAMIN 1000 MCG/ML IJ SOLN
1000.0000 ug | Freq: Once | INTRAMUSCULAR | Status: AC
  Administered 2022-05-08: 1000 ug via INTRAMUSCULAR

## 2022-05-08 NOTE — Progress Notes (Signed)
Pt in office for nurse visit due to vitamin B-12 deficiency. Administered 1000 mcg/mL Cyanocobalamin injection to patient in right deltoid and patient tolerated well with no issues. Pt states this is third B-12 injection and noticing a difference in energy levels. Will schedule nurse visit for next week before leaving today.

## 2022-05-15 ENCOUNTER — Ambulatory Visit (INDEPENDENT_AMBULATORY_CARE_PROVIDER_SITE_OTHER): Payer: Commercial Managed Care - PPO | Admitting: Physician Assistant

## 2022-05-15 ENCOUNTER — Encounter: Payer: Commercial Managed Care - PPO | Admitting: Nurse Practitioner

## 2022-05-15 ENCOUNTER — Encounter: Payer: Self-pay | Admitting: Physician Assistant

## 2022-05-15 DIAGNOSIS — R0683 Snoring: Secondary | ICD-10-CM

## 2022-05-15 DIAGNOSIS — G932 Benign intracranial hypertension: Secondary | ICD-10-CM | POA: Insufficient documentation

## 2022-05-15 DIAGNOSIS — H4711 Papilledema associated with increased intracranial pressure: Secondary | ICD-10-CM | POA: Diagnosis not present

## 2022-05-15 DIAGNOSIS — D509 Iron deficiency anemia, unspecified: Secondary | ICD-10-CM

## 2022-05-15 DIAGNOSIS — E559 Vitamin D deficiency, unspecified: Secondary | ICD-10-CM | POA: Diagnosis not present

## 2022-05-15 DIAGNOSIS — E538 Deficiency of other specified B group vitamins: Secondary | ICD-10-CM

## 2022-05-15 MED ORDER — CYANOCOBALAMIN 1000 MCG/ML IJ SOLN
1000.0000 ug | Freq: Once | INTRAMUSCULAR | Status: AC
  Administered 2022-05-15: 1000 ug via INTRAMUSCULAR

## 2022-05-15 NOTE — Assessment & Plan Note (Signed)
Secondary to fibroids / menstruation. Will start back on OTC slow release Fe with Vit C. Asymptomatic. Recheck in 3 months.

## 2022-05-15 NOTE — Assessment & Plan Note (Signed)
Encouraged her to work on nutritional and exercise changes.

## 2022-05-15 NOTE — Assessment & Plan Note (Signed)
Currently doing B12 injections. Recheck labs 3 months.

## 2022-05-15 NOTE — Progress Notes (Signed)
Subjective:    Patient ID: Sharon Stone, female    DOB: November 21, 1990, 32 y.o.   MRN: [id number]  Chief Complaint  Patient presents with   Referral    Pt in the office requesting referral for sleep study; also needing B-12 injection; otherwise pt has no other concerns.     HPI Patient is in today for recheck, discussion about Vit B12 & Vit D; referral for sleep study.   Following with [facility] for bilat papilledema and potential IIH. Detected in Jan 2024 by regular eye dr, then was referred to [facility]. Goes for lumbar puncture in May. Some headache, but nothing like previous in Jan / Feb.   Labs from 04/15/22 showed Vit D levels less that 3; Vit B12 at 139 pg/mL; Folate at 6.1 ng/mL; elevated homocysteine at 13.85; hemoglobin at 8.2 g/dL.   Long hx of anemia. Not currently on iron supplement, but has done this in the past.  Fibroids; terrible periods; following with GYN - currently on Myfembree.   Longstanding snoring history, unsure of any apnea hx; no hx of sleep study previously   Past Medical History:  Diagnosis Date   Allergy    Anxiety    Asthma    Depression    GERD (gastroesophageal reflux disease)    Health care maintenance 04/23/2022   Multiple food allergies     Past Surgical History:  Procedure Laterality Date   CESAREAN SECTION N/A 12/02/2014   Procedure: CESAREAN SECTION;  Surgeon: Tilda Burrow, MD;  Location: [facility];  Service: Obstetrics;  Laterality: N/A;   WISDOM TOOTH EXTRACTION  2010    Family History  Problem Relation Age of Onset   Cancer Mother    Depression Mother    Anxiety disorder Mother    Bipolar disorder Mother    Drug abuse Mother    Heart disease Maternal Grandmother    Diabetes Maternal Grandmother    Breast cancer Maternal Grandmother    Diabetes Maternal Aunt     Social History   Tobacco Use   Smoking status: Every Day    Packs/day: .25    Types: Cigarettes   Smokeless tobacco: Current  Vaping Use   Vaping Use: Never used   Substance Use Topics   Alcohol use: Yes   Drug use: Yes    Types: Marijuana    Comment: pt describes herself as "pothead"     Allergies  Allergen Reactions   Shellfish Allergy Anaphylaxis    Review of Systems NEGATIVE UNLESS OTHERWISE INDICATED IN HPI      Objective:     BP 120/80 (BP Location: Left Arm)   Pulse 96   Temp (!) 97.3 F (36.3 C) (Temporal)   Ht  (1.651 m)   Wt (!) 348 lb (157.9 kg)   LMP 05/10/2022 (Exact Date)   SpO2 98%   BMI 57.91 kg/m   Wt Readings from Last 3 Encounters:  05/15/22 (!) 348 lb (157.9 kg)  05/07/22 (!) 343 lb (155.6 kg)  04/23/22 (!) 342 lb (155.1 kg)    BP Readings from Last 3 Encounters:  05/15/22 120/80  05/07/22 122/76  04/23/22 130/82     Physical Exam Vitals and nursing note reviewed.  Constitutional:      Appearance: Normal appearance. She is obese.  Cardiovascular:     Rate and Rhythm: Normal rate and regular rhythm.  Pulmonary:     Effort: Pulmonary effort is normal.     Breath sounds: Normal breath sounds.  Neurological:  General: No focal deficit present.     Mental Status: She is alert and oriented to person, place, and time.  Psychiatric:        Mood and Affect: Mood normal.        Behavior: Behavior normal.        Thought Content: Thought content normal.        Assessment & Plan:  Morbid obesity due to excess calories Assessment & Plan: Encouraged her to work on nutritional and exercise changes.   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   IIH (idiopathic intracranial hypertension) Assessment & Plan: Following with [facility], will have LP in May   Vitamin B 12 deficiency Assessment & Plan: Currently doing B12 injections. Recheck labs 3 months.   Orders: -     Cyanocobalamin  Vitamin D deficiency Assessment & Plan: Currently on 50,000 iu once weekly. Recheck in 3 mo.     Iron deficiency anemia, unspecified iron deficiency anemia type Assessment & Plan: Secondary to fibroids / menstruation. Will start back on OTC slow release Fe with Vit C. Asymptomatic. Recheck in 3 months.    Snoring -     Ambulatory referral to Sleep Studies       Return in about 3 months (around 08/14/2022) for recheck/follow-up / labs .     Ayushi Pla M Sulema Braid, PA-C

## 2022-05-15 NOTE — Patient Instructions (Signed)
Continue supplements as directed. Eat iron-rich, Vit B12 -rich foods. See handouts.  Keep working on lifestyle goals. Proud of you!

## 2022-05-15 NOTE — Assessment & Plan Note (Signed)
Currently on 50,000 iu once weekly. Recheck in 3 mo.

## 2022-05-15 NOTE — Assessment & Plan Note (Signed)
Following with [facility], will have LP in May

## 2022-05-15 NOTE — Assessment & Plan Note (Signed)
Following with [facility]; minimal to no headaches to reports at this time.

## 2022-05-21 ENCOUNTER — Ambulatory Visit: Payer: Commercial Managed Care - PPO | Admitting: Nurse Practitioner

## 2022-06-05 DIAGNOSIS — H4711 Papilledema associated with increased intracranial pressure: Secondary | ICD-10-CM | POA: Diagnosis not present

## 2022-06-05 DIAGNOSIS — H47293 Other optic atrophy, bilateral: Secondary | ICD-10-CM | POA: Diagnosis not present

## 2022-06-05 DIAGNOSIS — H53453 Other localized visual field defect, bilateral: Secondary | ICD-10-CM | POA: Diagnosis not present

## 2022-06-05 DIAGNOSIS — H4710 Unspecified papilledema: Secondary | ICD-10-CM | POA: Diagnosis not present

## 2022-06-05 DIAGNOSIS — Z9189 Other specified personal risk factors, not elsewhere classified: Secondary | ICD-10-CM | POA: Diagnosis not present

## 2022-06-09 ENCOUNTER — Other Ambulatory Visit (HOSPITAL_COMMUNITY): Payer: Self-pay

## 2022-06-09 ENCOUNTER — Telehealth: Payer: Self-pay

## 2022-06-09 ENCOUNTER — Encounter: Payer: Self-pay | Admitting: Nurse Practitioner

## 2022-06-09 ENCOUNTER — Ambulatory Visit (INDEPENDENT_AMBULATORY_CARE_PROVIDER_SITE_OTHER): Payer: Commercial Managed Care - PPO | Admitting: Nurse Practitioner

## 2022-06-09 VITALS — BP 134/83 | HR 78 | Temp 98.1°F | Ht 65.0 in | Wt 345.0 lb

## 2022-06-09 DIAGNOSIS — Z6841 Body Mass Index (BMI) 40.0 and over, adult: Secondary | ICD-10-CM | POA: Diagnosis not present

## 2022-06-09 DIAGNOSIS — E559 Vitamin D deficiency, unspecified: Secondary | ICD-10-CM

## 2022-06-09 MED ORDER — CONTRAVE 8-90 MG PO TB12
ORAL_TABLET | ORAL | 0 refills | Status: AC
  Filled 2022-06-09: qty 120, 30d supply, fill #0
  Filled 2022-06-12 – 2022-06-19 (×2): qty 120, 40d supply, fill #0

## 2022-06-09 NOTE — Patient Instructions (Signed)

## 2022-06-09 NOTE — Progress Notes (Signed)
Office: [phone]  /  Fax: [phone]  WEIGHT SUMMARY AND BIOMETRICS  No data recorded Weight Gained Since Last Visit: 2lb   Vitals Temp: 98.1 F (36.7 C) BP: 134/83 Pulse Rate: 78 SpO2: 96 %   Anthropometric Measurements Height: 5\' 5"  (1.651 m) Weight: (!) 345 lb (156.5 kg) BMI (Calculated): 57.41 Weight at Last Visit: 343lb Weight Gained Since Last Visit: 2lb Starting Weight: 342lb Total Weight Loss (lbs): 0 lb (0 kg)   Body Composition  Body Fat %: 54.8 % Fat Mass (lbs): 189.4 lbs Muscle Mass (lbs): 148.4 lbs Total Body Water (lbs): 120.8 lbs Visceral Fat Rating : 21   Other Clinical Data Fasting: No Labs: No Today's Visit #: 3 Starting Date: 04/23/22     HPI  Chief Complaint: OBESITY  Sharon Stone is here to discuss her progress with her obesity treatment plan. She is on the keeping a food journal and adhering to recommended goals of 1500 calories and 90 protein and states she is following her eating plan approximately 0 % of the time. She states she is exercising 0 minutes 0 days per week.   Interval History:  Since last office visit she has gained 2 pounds.  She has had 2 deaths in her family since her last visit.  She had a spinal tap on 06/05/22 for bilateral papilledema and is waiting for results.  She has a follow up appt scheduled 06/18/22. She is trying to journal her food.  She has gotten off track and plans to get back on track.  She is drinking water, diet lemonade and tea.  Struggles with polyphagia and cravings.    Pharmacotherapy for weight loss: She is not currently taking medications  for medical weight loss.    Previous pharmacotherapy for medical weight loss:  None  Bariatric surgery:  Patient has not had bariatric surgery.    Vitamin D Deficiency Vitamin D is not at goal of 50.  Most recent vitamin D level was 3. She is on  prescription ergocalciferol 50,000 IU weekly prescribed by her PCP.  Denies side effects   No results found  for: "VD25OH"    PHYSICAL EXAM:  Blood pressure 134/83, pulse 78, temperature 98.1 F (36.7 C), height 5\' 5"  (1.651 m), weight (!) 345 lb (156.5 kg), last menstrual period 05/10/2022, SpO2 96 %. Body mass index is 57.41 kg/m.  General: She is overweight, cooperative, alert, well developed, and in no acute distress. PSYCH: Has normal mood, affect and thought process.   Extremities: No edema.  Neurologic: No gross sensory or motor deficits. No tremors or fasciculations noted.    DIAGNOSTIC DATA REVIEWED:  BMET    Component Value Date/Time   NA 136 05/29/2016 0323   K 3.6 05/29/2016 0323   CL 107 05/29/2016 0323   CO2 23 05/29/2016 0323   GLUCOSE 105 (H) 05/29/2016 0323   BUN 8 05/29/2016 0323   CREATININE 0.54 05/29/2016 0323   CALCIUM 9.0 05/29/2016 0323   GFRNONAA >60 05/29/2016 0323   GFRAA >60 05/29/2016 0323   No results found for: "HGBA1C" Lab Results  Component Value Date   INSULIN 19.7 04/23/2022   Lab Results  Component Value Date   TSH 2.118 05/30/2016   CBC    Component Value Date/Time   WBC 10.1 05/29/2016 1156   RBC 3.81 (L) 05/29/2016 1156   HGB 9.7 (L) 05/29/2016 1156   HCT 29.4 (L) 05/29/2016 1156   PLT 128 (L) 05/29/2016 1156   MCV 77.2 (L) 05/29/2016  1156   MCH 25.5 (L) 05/29/2016 1156   MCHC 33.0 05/29/2016 1156   RDW 16.1 (H) 05/29/2016 1156   Iron Studies No results found for: "IRON", "TIBC", "FERRITIN", "IRONPCTSAT" Lipid Panel  No results found for: "CHOL", "TRIG", "HDL", "CHOLHDL", "VLDL", "LDLCALC", "LDLDIRECT" Hepatic Function Panel     Component Value Date/Time   PROT 7.3 05/29/2016 0323   ALBUMIN 2.8 (L) 05/29/2016 0323   AST 15 05/29/2016 0323   ALT 13 (L) 05/29/2016 0323   ALKPHOS 77 05/29/2016 0323   BILITOT 0.2 (L) 05/29/2016 0323      Component Value Date/Time   TSH 2.118 05/30/2016 0949   Nutritional No results found for: "VD25OH"   ASSESSMENT AND PLAN  TREATMENT PLAN FOR OBESITY:  Recommended Dietary  Goals  Sharon Stone is currently in the action stage of change. As such, her goal is to continue weight management plan. She has agreed to the Category 3 Plan.  Behavioral Intervention  We discussed the following Behavioral Modification Strategies today: increasing lean protein intake, decreasing simple carbohydrates , increasing vegetables, increasing lower glycemic fruits, avoiding skipping meals, increasing water intake, continue to practice mindfulness when eating, and planning for success.  Additional resources provided today: NA  Recommended Physical Activity Goals  Sharon Stone has been advised to work up to 150 minutes of moderate intensity aerobic activity a week and strengthening exercises 2-3 times per week for cardiovascular health, weight loss maintenance and preservation of muscle mass.   She has agreed to Think about ways to increase daily physical activity and overcoming barriers to exercise, Increase physical activity in their day and reduce sedentary time (increase NEAT)., and Work on scheduling and tracking physical activity.    Pharmacotherapy We discussed various medication options to help Sharon Stone with her weight loss efforts and we both agreed to start Contrave.  Patient knows not to get pregnant while taking weight loss meds.  '  Consider Metformin in the future for IR.    ASSOCIATED CONDITIONS ADDRESSED TODAY  Action/Plan  Vitamin D deficiency Continue to follow up with PCP. Continue Vit D as directed .  Morbid obesity (HCC) -     Contrave; Take 1 tablet every morning for 7 days, THEN 1 tablet 2 (two) times daily for 7 days, THEN 2 tablets every morning and 1 tablet every evening for 7 days, THEN 2 tablets 2 (two) times daily  Dispense: 120 tablet; Refill: 0  BMI 50.0-59.9, adult (HCC) -     Contrave; Take 1 tablet every morning for 7 days, THEN 1 tablet 2 (two) times daily for 7 days, THEN 2 tablets every morning and 1 tablet every evening for 7 days, THEN 2 tablets 2  (two) times daily  Dispense: 120 tablet; Refill: 0      Goal: Increase water intake   Return in about 2 weeks (around 06/23/2022).Marland Kitchen She was informed of the importance of frequent follow up visits to maximize her success with intensive lifestyle modifications for her multiple health conditions.   ATTESTASTION STATEMENTS:  Reviewed by clinician on day of visit: allergies, medications, problem list, medical history, surgical history, family history, social history, and previous encounter notes.      Theodis Sato. Jeanet Lupe FNP-C

## 2022-06-09 NOTE — Telephone Encounter (Signed)
PA submitted through Cover My Meds for Contrave. Awaiting insurance determination.  Key: [id number]

## 2022-06-10 ENCOUNTER — Other Ambulatory Visit (HOSPITAL_COMMUNITY): Payer: Self-pay

## 2022-06-10 NOTE — Telephone Encounter (Signed)
Per Cover My Meds: The request has been approved. The authorization is effective from 07/11/2022 to 10/09/2022

## 2022-06-12 ENCOUNTER — Other Ambulatory Visit (HOSPITAL_COMMUNITY): Payer: Self-pay

## 2022-06-12 ENCOUNTER — Telehealth (INDEPENDENT_AMBULATORY_CARE_PROVIDER_SITE_OTHER): Payer: Self-pay | Admitting: Nurse Practitioner

## 2022-06-12 NOTE — Telephone Encounter (Signed)
Medication has been approved. Mychart message sent to patient.

## 2022-06-12 NOTE — Telephone Encounter (Signed)
Pt called 06/12/22 she checked with her pharmacy for Contrave.She was told need a Prior Auth

## 2022-06-18 ENCOUNTER — Institutional Professional Consult (permissible substitution): Payer: Commercial Managed Care - PPO | Admitting: Neurology

## 2022-06-18 DIAGNOSIS — H4711 Papilledema associated with increased intracranial pressure: Secondary | ICD-10-CM | POA: Diagnosis not present

## 2022-06-19 ENCOUNTER — Other Ambulatory Visit (HOSPITAL_COMMUNITY): Payer: Self-pay

## 2022-07-08 ENCOUNTER — Ambulatory Visit: Payer: Commercial Managed Care - PPO | Admitting: Nurse Practitioner

## 2022-07-17 ENCOUNTER — Encounter: Payer: Self-pay | Admitting: Neurology

## 2022-07-17 ENCOUNTER — Ambulatory Visit (INDEPENDENT_AMBULATORY_CARE_PROVIDER_SITE_OTHER): Payer: Commercial Managed Care - PPO | Admitting: Neurology

## 2022-07-17 VITALS — BP 129/89 | HR 79 | Ht 65.0 in | Wt 345.0 lb

## 2022-07-17 DIAGNOSIS — D509 Iron deficiency anemia, unspecified: Secondary | ICD-10-CM | POA: Diagnosis not present

## 2022-07-17 DIAGNOSIS — E662 Morbid (severe) obesity with alveolar hypoventilation: Secondary | ICD-10-CM

## 2022-07-17 DIAGNOSIS — R0683 Snoring: Secondary | ICD-10-CM | POA: Diagnosis not present

## 2022-07-17 DIAGNOSIS — G4719 Other hypersomnia: Secondary | ICD-10-CM | POA: Diagnosis not present

## 2022-07-17 DIAGNOSIS — G932 Benign intracranial hypertension: Secondary | ICD-10-CM | POA: Diagnosis not present

## 2022-07-17 NOTE — Patient Instructions (Signed)
ASSESSMENT AND PLAN 32 year -old female patient here with:  0) reported snoring,, risk factor #1 for obstructive sleep apnea is a body mass index which has now exceeded 57.   1) recently diagnosed idiopathic intracranial hypertension, records from [facility] were reviewed, records from primary care were reviewed.  The patient has started after LP on Diamox 500 mg twice daily.  Her opening pressure was severely elevated at 34 cm water.  2) the patient is at high risk of having obstructive sleep apnea or obesity related hypoventilation.  This would increase nocturnal Headache risk as well, manifesting as cluster headaches.  Contributing can be also her chronic anemia, her blood insulin resistance, in the past she had a vitamin B12 deficiency, she is a smoker, she has gastroesophageal reflux disease which can mimic apnea and also contributes to poor quality sleep and nonrestorative sleep.  3) the plan for this patient is to undergo a home sleep test, she needs to continue taking Diamox, like for her to be on a multivitamin with folic acid, vitamin B12, and also iron.  Often a prenatal vitamin has this combination..  Once her home sleep test has been evaluated and interpreted we will need to see the response to therapy as needed.  If the patient has obesity hypoventilation with low oxygen level it would definitely of benefit to be on positive airway pressure therapy and BiPAP is often better tolerated than CPAP.  I plan to follow up either personally or through our NP within 3-5 months.  Screening for Sleep Apnea  Sleep apnea is a condition in which breathing pauses or becomes shallow during sleep. Sleep apnea screening is a test to determine if you are at risk for sleep apnea. The test includes a series of questions. It will only takes a few minutes. Your health care provider may ask you to have this test in preparation for surgery or as part of a physical exam. What are the symptoms of sleep  apnea? Common symptoms of sleep apnea include: Snoring. Waking up often at night. Daytime sleepiness. Pauses in breathing. Choking or gasping during sleep. Irritability. Forgetfulness. Trouble thinking clearly. Depression. Personality changes. Most people with sleep apnea do not know that they have it. What are the advantages of sleep apnea screening? Getting screened for sleep apnea can help: Ensure your safety. It is important for your health care providers to know whether or not you have sleep apnea, especially if you are having surgery or have other long-term (chronic) health conditions. Improve your health and allow you to get a better night's rest. Restful sleep can help you: Have more energy. Lose weight. Improve high blood pressure. Improve diabetes management. Prevent stroke. Prevent car accidents. What happens during the screening? Screening usually includes being asked a list of questions about your sleep quality. Some questions you may be asked include: Do you snore? Is your sleep restless? Do you have daytime sleepiness? Has a partner or spouse told you that you stop breathing during sleep? Have you had trouble concentrating or memory loss? What is your age? What is your neck circumference? To measure your neck, keep your back straight and gently wrap the tape measure around your neck. Put the tape measure at the middle of your neck, between your chin and collarbone. What is your sex assigned at birth? Do you have or are you being treated for high blood pressure? If your screening test is positive, you are at risk for the condition. Further testing may be  needed to confirm a diagnosis of sleep apnea. Where to find more information You can find screening tools online or at your health care clinic. For more information about sleep apnea screening and healthy sleep, visit these websites: Centers for Disease Control and Prevention: [employer] American Sleep Apnea  Association: www.sleepapnea.org Contact a health care provider if: You think that you may have sleep apnea. Summary Sleep apnea screening can help determine if you are at risk for sleep apnea. It is important for your health care providers to know whether or not you have sleep apnea, especially if you are having surgery or have other chronic health conditions. You may be asked to take a screening test for sleep apnea in preparation for surgery or as part of a physical exam. This information is not intended to replace advice given to you by your health care provider. Make sure you discuss any questions you have with your health care provider. Document Revised: 12/23/2019 Document Reviewed: 12/23/2019 Elsevier Patient Education  2024 [employer].

## 2022-07-17 NOTE — Progress Notes (Signed)
SLEEP MEDICINE CLINIC    Provider:  Melvyn Novas, MD  Primary Care Physician:  Allwardt, Crist Infante, PA-C [address] [place] [address]     Referring Provider: Allwardt, Crist Infante, Pa-c [address] [place],  [place] [address]          Chief Complaint according to patient   Patient presents with:     New Patient (Initial Visit)           HISTORY OF PRESENT ILLNESS:  Sharon Stone is a 32 y.o. female patient who is seen upon referral on 07/17/2022 from Dr Doloris Hall, PA- [facility]. Both suggested a sleep study for snoring and as contributing factor to headache.  This patient has been diagnosed with papilloedema , associated with increased intracranial pressure. She has a long standing anemia history   Dx :  IIH at [facility] : Dr Doree Albee, MD   INTERVAL Hx: Sharon Stone is a 32 y.o. 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  04/25/22 She had MRI brain/MRV head with and without contrast IMPRESSION:  1. 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 2. No dural venous sinus thrombosis or specific venous signs of IIH.  She had LP 06/05/22 with OP of 35cm water normal contents. Improvement in headache. Started on Diamox.   Symptoms of IIH: Headache - improved Tinnitus - no TVOs -no Diplopia - no Meds - vitA derivatives or tetracycline exposure - no OSA - sleep study is scheduled Anemia - started iron pills HTN - no Weight change - she is not sure, she is physically active  PRIOR NOTE:  05/08/22 Patient examined in clinic with swelling on atrophy, although her studies were actually stable. There was some interval improvement in HVF, patient has lost some weight. There has been no improvement in RNFL thickness today. Patient is not yet on diamox pending LP, but LP scheduled for July. Message sent requesting to move date to ASAP. If LP cannot happen sooner  she will start diamox.   - Continue weight loss efforts - Start iron supplementation for anemia - Referral placed for sleep study - Advised smoking cessation - Prescribed diamox 500mg  BID but instructed patient to start only if LP date is farther out than a few weeks. - Reviewed risk factors for IIH and vision loss, reviewed side effects of diamox.     I have the pleasure of seeing Sharon Stone 07/17/22 a right -handed  AA female with a possible sleep disorder, reporting IIH and headaches, Obesity and snoring, EDS and fatigue.   Sleep relevant medical history: Nocturia 3-4 times  nightly , Asthma, smoker,    Family medical /sleep history: No other family member with OSA.    Social history: adopted at age 37.  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 Family status is living with partner , with 2 children, her mother..  The patient currently works in daytime, from home.  Tobacco use- yes.   ETOH use : 2-3 a month,  Caffeine intake in form of Coffee( some days) [facility]) Tea ( /) or energy drinks   Sleep habits are as follows: The patient's dinner time is not set- between 2-8 PM. The patient goes to bed at 2.30 AM, a;ways been a night owl-  and continues to sleep for 5-6  hours,  broken sleep-wakes for several  bathroom breaks.  Her headaches have woken her from sleep - before diamox .stabbing and sharp. The preferred sleep position is lateral , with the support of 3 pillows. Orthopnea, GERD,  Dreams are reportedly infrequent. The patient wakes up with an alarm. 7.15 AM is the usual rise time. She reports not feeling refreshed or restored in AM, with symptoms such as dry mouth, morning headaches, and residual fatigue.  Naps are taken infrequently, lasting from 1-2 hours  to and are no more refreshing than nocturnal sleep.    Review of Systems: Out of a complete 14 system review, the patient complains of only the  following symptoms, and all other reviewed systems are negative.:  Fatigue, sleepiness , snoring, fragmented sleep ,Nocturia    How likely are you to doze in the following situations: 0 = not likely, 1 = slight chance, 2 = moderate chance, 3 = high chance   Sitting and Reading? Watching Television? Sitting inactive in a public place (theater or meeting)? As a passenger in a car for an hour without a break? Lying down in the afternoon when circumstances permit? Sitting and talking to someone? Sitting quietly after lunch without alcohol? In a car, while stopped for a few minutes in traffic?   Total = 10/ 24 points   FSS endorsed at 38/ 63 points.   Social History   Socioeconomic History   Marital status: Single    Spouse name: Not on file   Number of children: Not on file   Years of education: Not on file   Highest education level: Bachelor's degree (e.g., BA, AB, BS)  Occupational History   Not on file  Tobacco Use   Smoking status: Every Day    Packs/day: .25    Types: Cigarettes   Smokeless tobacco: Current  Vaping Use   Vaping Use: Never used  Substance and Sexual Activity   Alcohol use: Yes   Drug use: Yes    Types: Marijuana    Comment: pt describes herself as "pothead"   Sexual activity: Yes    Birth control/protection: None  Other Topics Concern   Not on file  Social History Narrative   Not on file   Social Determinants of Health   Financial Resource Strain: Medium Risk (04/24/2022)   Overall Financial Resource Strain (CARDIA)    Difficulty of Paying Living Expenses: Somewhat hard  Food Insecurity: Food Insecurity Present (04/24/2022)   Hunger Vital Sign    Worried About Running Out of Food in the Last Year: Sometimes true    Ran Out of Food in the Last Year: Often true  Transportation Needs: No Transportation Needs (04/24/2022)   PRAPARE - Administrator, Civil Service (Medical): No    Lack of Transportation (Non-Medical): No  Physical  Activity: Unknown (04/24/2022)   Exercise Vital Sign    Days of Exercise per Week: 0 days    Minutes of Exercise per Session: Not on file  Stress: No Stress Concern Present (04/24/2022)   [employer] of Occupational Health - Occupational Stress Questionnaire    Feeling of Stress : Only a little  Social Connections: Moderately Isolated (04/24/2022)   Social Connection and Isolation Panel [NHANES]    Frequency of Communication with Friends and Family: More than three times a week    Frequency of Social Gatherings with Friends and Family: Twice a week    Attends Religious Services: Never    Database administrator or Organizations: No  Attends Banker Meetings: Not on file    Marital Status: Living with partner    Family History  Problem Relation Age of Onset   Cancer Mother    Depression Mother    Anxiety disorder Mother    Bipolar disorder Mother    Drug abuse Mother    Heart disease Maternal Grandmother    Diabetes Maternal Grandmother    Breast cancer Maternal Grandmother    Diabetes Maternal Aunt     Past Medical History:  Diagnosis Date   Allergy    Anxiety    Asthma    Depression    GERD (gastroesophageal reflux disease)    Health care maintenance 04/23/2022   Multiple food allergies     Past Surgical History:  Procedure Laterality Date   CESAREAN SECTION N/A 12/02/2014   Procedure: CESAREAN SECTION;  Surgeon: Tilda Burrow, MD;  Location: [facility];  Service: Obstetrics;  Laterality: N/A;   WISDOM TOOTH EXTRACTION  2010     Current Outpatient Medications on File Prior to Visit  Medication Sig Dispense Refill   acetaminophen (TYLENOL) 500 MG tablet Take 1,000 mg by mouth every 6 (six) hours as needed for headache.     Cyanocobalamin (B-12 COMPLIANCE INJECTION) 1000 MCG/ML KIT Inject 1,000 mcg as directed once a week. 4 kit 1   ibuprofen (ADVIL,MOTRIN) 600 MG tablet Take 1 tablet (600 mg total) by mouth every 6 (six) hours as needed. 30 tablet 0    MYFEMBREE 40-1-0.5 MG TABS Take 1 tablet every day by oral route for 180 days.     Naltrexone-buPROPion HCl ER (CONTRAVE) 8-90 MG TB12 Take 1 tablet every morning for 7 days, THEN 1 tablet 2 (two) times daily for 7 days, THEN 2 tablets every morning and 1 tablet every evening for 7 days, THEN 2 tablets 2 (two) times daily 120 tablet 0   Vitamin D, Ergocalciferol, (DRISDOL) 1.25 MG (50000 UNIT) CAPS capsule Take 1 capsule (50,000 Units total) by mouth every 7 (seven) days. 12 capsule 0   No current facility-administered medications on file prior to visit.    Allergies  Allergen Reactions   Shellfish Allergy Anaphylaxis     DIAGNOSTIC DATA (LABS, IMAGING, TESTING) - I reviewed patient records, labs, notes, testing and imaging myself where available.  Lab Results  Component Value Date   WBC 10.1 05/29/2016   HGB 9.7 (L) 05/29/2016   HCT 29.4 (L) 05/29/2016   MCV 77.2 (L) 05/29/2016   PLT 128 (L) 05/29/2016      Component Value Date/Time   NA 136 05/29/2016 0323   K 3.6 05/29/2016 0323   CL 107 05/29/2016 0323   CO2 23 05/29/2016 0323   GLUCOSE 105 (H) 05/29/2016 0323   BUN 8 05/29/2016 0323   CREATININE 0.54 05/29/2016 0323   CALCIUM 9.0 05/29/2016 0323   PROT 7.3 05/29/2016 0323   ALBUMIN 2.8 (L) 05/29/2016 0323   AST 15 05/29/2016 0323   ALT 13 (L) 05/29/2016 0323   ALKPHOS 77 05/29/2016 0323   BILITOT 0.2 (L) 05/29/2016 0323   GFRNONAA >60 05/29/2016 0323   GFRAA >60 05/29/2016 0323   No results found for: "CHOL", "HDL", "LDLCALC", "LDLDIRECT", "TRIG", "CHOLHDL" No results found for: "HGBA1C" No results found for: "VITAMINB12" Lab Results  Component Value Date   TSH 2.118 05/30/2016    PHYSICAL EXAM:  Today's Vitals   07/17/22 1246  BP: 129/89  Pulse: 79  Weight: (!) 345 lb (156.5 kg)  Height: 5\' 5"  (  1.651 m)   Body mass index is 57.41 kg/m.   Wt Readings from Last 3 Encounters:  07/17/22 (!) 345 lb (156.5 kg)  06/09/22 (!) 345 lb (156.5 kg)   05/15/22 (!) 348 lb (157.9 kg)     Ht Readings from Last 3 Encounters:  07/17/22 5\' 5"  (1.651 m)  06/09/22 5\' 5"  (1.651 m)  05/15/22 5\' 5"  (1.651 m)      General: The patient is awake, alert and appears not in acute distress. The patient is relaxed.  Head: Normocephalic, atraumatic. Neck is supple. Mallampati 3 plus ,  neck circumference:17 inches.  Nasal airflow patent.  Retrognathia is  seen.  Dental status: biological , small lower jaw, crowded dentition, small mouth.  Cardiovascular:  Regular rate and cardiac rhythm by pulse,  without distended neck veins. Respiratory: Lungs are clear to auscultation.  Skin:  Without evidence of ankle edema, or rash. Trunk: The patient's posture is erect.   NEUROLOGIC EXAM: The patient is awake and alert, oriented to place and time.   Memory subjective described as intact.  Attention span & concentration ability appears normal.  Speech is fluent,  without  dysarthria, dysphonia or aphasia.  Mood and affect are appropriate.   Cranial nerves: no loss of smell or taste reported  Pupils are equal and briskly reactive to light. Funduscopic exam  with bilateral papilledema, pale. No bleed.   Extraocular movements in vertical and horizontal planes were intact and without nystagmus. No Diplopia. Visual fields by finger perimetry are intact. Hearing was intact to soft voice and finger rubbing.    Facial sensation intact to fine touch.  Facial motor strength is symmetric and tongue and uvula move midline.  Neck ROM : rotation, tilt and flexion extension were normal for age and shoulder shrug was symmetrical.    Motor exam:  Symmetric bulk, tone and ROM.   Normal tone without cog wheeling, symmetric grip strength .   Sensory:  Fine touch and vibration were tested  - she felt tingling in her fingers.  Proprioception tested in the upper extremities was normal.   Coordination: Rapid alternating movements in the fingers/hands were of normal speed.   The Finger-to-nose maneuver was intact without evidence of ataxia, dysmetria or tremor.   Gait and station: Patient could rise unassisted from a seated position, walked without assistive device.  Deep tendon reflexes: in the  upper and lower extremities are symmetric and intact.  Babinski response was deferred.  ASSESSMENT AND PLAN 32 year -old female patient here with:  0) reported snoring,, risk factor #1 for obstructive sleep apnea is a body mass index which has now exceeded 57.   1) recently diagnosed idiopathic intracranial hypertension, records from [facility] ophthalmology were reviewed, records from primary care were reviewed.  The patient has started after LP on Diamox 500 mg twice daily.  Her opening pressure was severely elevated at 34 cm water.  2) the patient is at high risk of having obstructive sleep apnea or obesity related hypoventilation.  This would increase nocturnal Headache risk as well, manifesting as cluster headaches.  Contributing can be also her chronic anemia, her blood insulin resistance, in the past she had a vitamin B12 deficiency, she is a smoker, she has gastroesophageal reflux disease which can mimic apnea and also contributes to poor quality sleep and nonrestorative sleep.  3) the plan for this patient is to undergo a home sleep test, she needs to continue taking Diamox, like for her to be on a multivitamin  with folic acid, vitamin B12, and also iron.  Often a prenatal vitamin has this combination..  Once her home sleep test has been evaluated and interpreted we will need to see the response to therapy as needed.  If the patient has obesity hypoventilation with low oxygen level it would definitely of benefit to be on positive airway pressure therapy and BiPAP is often better tolerated than CPAP.  I plan to follow up either personally or through our NP within 3-5 months.   I would like to thank Allwardt, Crist Infante, PA-C for allowing me to meet with and to take care  of this pleasant patient.  After spending a total time of  45  minutes face to face and additional time for physical and neurologic examination, review of laboratory studies,  personal review of imaging studies, reports and results of other testing and review of referral information / records as far as provided in visit,   Electronically signed by: Melvyn Novas, MD 07/17/2022 1:13 PM  [facility] Board certified by The [employer] of Sleep Medicine and Diplomate of the [employer] of Sleep Medicine. Board certified In Neurology through the ABPN, Fellow of the [employer] of Neurology.

## 2022-07-21 ENCOUNTER — Encounter: Payer: Commercial Managed Care - PPO | Admitting: Physician Assistant

## 2022-07-22 ENCOUNTER — Ambulatory Visit: Payer: Commercial Managed Care - PPO | Admitting: Nurse Practitioner

## 2022-07-29 ENCOUNTER — Telehealth: Payer: Self-pay | Admitting: Neurology

## 2022-07-29 NOTE — Telephone Encounter (Signed)
[facility] aetna no auth req/[facility] [employer] pending faxed notes

## 2022-08-04 NOTE — Telephone Encounter (Signed)
[facility] aetna no auth req/[facility] [employer] blue no auth req since its secondary

## 2022-08-14 ENCOUNTER — Ambulatory Visit (INDEPENDENT_AMBULATORY_CARE_PROVIDER_SITE_OTHER): Payer: Commercial Managed Care - PPO | Admitting: Physician Assistant

## 2022-08-14 ENCOUNTER — Encounter: Payer: Self-pay | Admitting: Physician Assistant

## 2022-08-14 VITALS — BP 111/76 | HR 88 | Temp 98.0°F | Ht 65.0 in | Wt 345.4 lb

## 2022-08-14 DIAGNOSIS — E538 Deficiency of other specified B group vitamins: Secondary | ICD-10-CM

## 2022-08-14 DIAGNOSIS — D509 Iron deficiency anemia, unspecified: Secondary | ICD-10-CM | POA: Diagnosis not present

## 2022-08-14 DIAGNOSIS — E559 Vitamin D deficiency, unspecified: Secondary | ICD-10-CM

## 2022-08-14 DIAGNOSIS — G932 Benign intracranial hypertension: Secondary | ICD-10-CM | POA: Diagnosis not present

## 2022-08-14 DIAGNOSIS — Z6841 Body Mass Index (BMI) 40.0 and over, adult: Secondary | ICD-10-CM

## 2022-08-14 LAB — IBC + FERRITIN
Ferritin: 3 ng/mL — ABNORMAL LOW (ref 10.0–291.0)
Iron: 18 ug/dL — ABNORMAL LOW (ref 42–145)
Saturation Ratios: 4.3 % — ABNORMAL LOW (ref 20.0–50.0)
TIBC: 420 ug/dL (ref 250.0–450.0)
Transferrin: 300 mg/dL (ref 212.0–360.0)

## 2022-08-14 LAB — CBC WITH DIFFERENTIAL/PLATELET
Basophils Absolute: 0 10*3/uL (ref 0.0–0.1)
Basophils Relative: 0.4 % (ref 0.0–3.0)
Eosinophils Absolute: 0.2 10*3/uL (ref 0.0–0.7)
Eosinophils Relative: 2.9 % (ref 0.0–5.0)
HCT: 28.9 % — ABNORMAL LOW (ref 36.0–46.0)
Hemoglobin: 9 g/dL — ABNORMAL LOW (ref 12.0–15.0)
Lymphocytes Relative: 28.9 % (ref 12.0–46.0)
Lymphs Abs: 1.7 10*3/uL (ref 0.7–4.0)
MCHC: 31 g/dL (ref 30.0–36.0)
MCV: 66.4 fl — ABNORMAL LOW (ref 78.0–100.0)
Monocytes Absolute: 0.2 10*3/uL (ref 0.1–1.0)
Monocytes Relative: 3.6 % (ref 3.0–12.0)
Neutro Abs: 3.8 10*3/uL (ref 1.4–7.7)
Neutrophils Relative %: 64.2 % (ref 43.0–77.0)
Platelets: 159 10*3/uL (ref 150.0–400.0)
RBC: 4.36 Mil/uL (ref 3.87–5.11)
RDW: 20.6 % — ABNORMAL HIGH (ref 11.5–15.5)
WBC: 5.9 10*3/uL (ref 4.0–10.5)

## 2022-08-14 LAB — VITAMIN B12: Vitamin B-12: >1501 pg/mL — ABNORMAL HIGH (ref 211–911)

## 2022-08-14 LAB — VITAMIN D 25 HYDROXY (VIT D DEFICIENCY, FRACTURES): VITD: 14.39 ng/mL — ABNORMAL LOW (ref 30.00–100.00)

## 2022-08-14 NOTE — Assessment & Plan Note (Signed)
Following with [facility], symptoms improving

## 2022-08-14 NOTE — Assessment & Plan Note (Signed)
Secondary to fibroids / menstruation.  OTC slow release Fe with Vit C. Asymptomatic. Recheck today

## 2022-08-14 NOTE — Progress Notes (Signed)
Subjective:    Patient ID: Sharon Stone, female    DOB: 03/18/1990, 32 y.o.   MRN: [id number]  Chief Complaint  Patient presents with   blood work     B12  Vit D    Headache    Headache    Patient is in today for 3 month recheck.   Headaches are improved. Continues to follow with [facility], taking Diamox, will recheck after completion of that.   Feeling better overall. Still taking regular B12 and Vit D supplements.   GYN f/up on 7/22 - will have [place] to f/up on fibroids.  Still taking iron supplements as well.   Past Medical History:  Diagnosis Date   Allergy    Anxiety    Asthma    Depression    GERD (gastroesophageal reflux disease)    Health care maintenance 04/23/2022   Multiple food allergies     Past Surgical History:  Procedure Laterality Date   CESAREAN SECTION N/A 12/02/2014   Procedure: CESAREAN SECTION;  Surgeon: Tilda Burrow, MD;  Location: [facility];  Service: Obstetrics;  Laterality: N/A;   WISDOM TOOTH EXTRACTION  2010    Family History  Problem Relation Age of Onset   Cancer Mother    Depression Mother    Anxiety disorder Mother    Bipolar disorder Mother    Drug abuse Mother    Heart disease Maternal Grandmother    Diabetes Maternal Grandmother    Breast cancer Maternal Grandmother    Diabetes Maternal Aunt     Social History   Tobacco Use   Smoking status: Every Day    Current packs/day: 0.25    Types: Cigarettes   Smokeless tobacco: Current  Vaping Use   Vaping status: Never Used  Substance Use Topics   Alcohol use: Yes   Drug use: Yes    Types: Marijuana    Comment: pt describes herself as "pothead"     Allergies  Allergen Reactions   Shellfish Allergy Anaphylaxis    Review of Systems  Neurological:  Positive for headaches.   NEGATIVE UNLESS OTHERWISE INDICATED IN HPI      Objective:     BP 111/76   Pulse 88   Temp 98 F (36.7 C) (Temporal)   Ht 5\' 5"  (1.651 m)   Wt (!) 345 lb 6.4 oz (156.7 kg)   LMP  07/22/2022   SpO2 98%   BMI 57.48 kg/m   Wt Readings from Last 3 Encounters:  08/14/22 (!) 345 lb 6.4 oz (156.7 kg)  07/17/22 (!) 345 lb (156.5 kg)  06/09/22 (!) 345 lb (156.5 kg)    BP Readings from Last 3 Encounters:  08/14/22 111/76  07/17/22 129/89  06/09/22 134/83     Physical Exam Vitals and nursing note reviewed.  Constitutional:      Appearance: Normal appearance. She is obese.  HENT:     Right Ear: Tympanic membrane, ear canal and external ear normal.     Left Ear: Tympanic membrane, ear canal and external ear normal.  Cardiovascular:     Rate and Rhythm: Normal rate and regular rhythm.  Pulmonary:     Effort: Pulmonary effort is normal.     Breath sounds: Normal breath sounds.  Musculoskeletal:     Right lower leg: No edema.     Left lower leg: No edema.  Neurological:     General: No focal deficit present.     Mental Status: She is alert and oriented to person,  place, and time.  Psychiatric:        Mood and Affect: Mood normal.        Behavior: Behavior normal.        Thought Content: Thought content normal.        Assessment & Plan:  Vitamin D deficiency Assessment & Plan: Currently on daily OTC supplement. Recheck today.   Orders: -     VITAMIN D 25 Hydroxy (Vit-D Deficiency, Fractures)  Vitamin B 12 deficiency Assessment & Plan: Recheck today  Orders: -     Vitamin B12  Iron deficiency anemia, unspecified iron deficiency anemia type Assessment & Plan: Secondary to fibroids / menstruation.  OTC slow release Fe with Vit C. Asymptomatic. Recheck today  Orders: -     CBC with Differential/Platelet -     IBC + Ferritin  Morbid obesity due to excess calories [facility]) Assessment & Plan: Encouraged her to work on nutritional and exercise changes.   IIH (idiopathic intracranial hypertension) Assessment & Plan: Following with [facility], symptoms improving         Return in about 6 months (around 02/14/2023) for  recheck/follow-up.    Gilberto Streck M Meranda Dechaine, PA-C

## 2022-08-14 NOTE — Assessment & Plan Note (Signed)
Recheck today. 

## 2022-08-14 NOTE — Assessment & Plan Note (Signed)
Encouraged her to work on nutritional and exercise changes.

## 2022-08-14 NOTE — Assessment & Plan Note (Signed)
Currently on daily OTC supplement. Recheck today.

## 2022-08-19 ENCOUNTER — Other Ambulatory Visit: Payer: Self-pay

## 2022-08-19 MED ORDER — VITAMIN D (ERGOCALCIFEROL) 1.25 MG (50000 UNIT) PO CAPS: 50000.0000 [IU] | ORAL_CAPSULE | ORAL | 0 refills | Status: DC

## 2022-09-02 ENCOUNTER — Ambulatory Visit: Payer: Commercial Managed Care - PPO | Admitting: Neurology

## 2022-09-02 DIAGNOSIS — G932 Benign intracranial hypertension: Secondary | ICD-10-CM

## 2022-09-02 DIAGNOSIS — E662 Morbid (severe) obesity with alveolar hypoventilation: Secondary | ICD-10-CM

## 2022-09-02 DIAGNOSIS — D509 Iron deficiency anemia, unspecified: Secondary | ICD-10-CM

## 2022-09-02 DIAGNOSIS — G4710 Hypersomnia, unspecified: Secondary | ICD-10-CM

## 2022-09-02 DIAGNOSIS — G4719 Other hypersomnia: Secondary | ICD-10-CM

## 2022-09-02 DIAGNOSIS — R0683 Snoring: Secondary | ICD-10-CM

## 2022-09-03 NOTE — Progress Notes (Signed)
[facility]  Sharon Stone 32 year old female [birth year]   HOME SLEEP TEST REPORT ( by [facility] PAT)   STUDY DATE:  09-04-2022 DOB: 1990/08/09     ORDERING CLINICIAN: Melvyn Novas, MD  REFERRING CLINICIAN: Allwardt, PA    CLINICAL INFORMATION/HISTORY:Super Obesity, Papilloedema patient, excessive daytime sleepiness, at risk for sleep apnea. Nocturia up to 4 times, smoker.  Sharon Stone is a 32 y.o. female patient who is seen upon referral on 07/17/2022 from Dr Doloris Hall, PA- [facility]. Both suggested a sleep study for snoring and as contributing factor to headache.  This patient has been diagnosed with papilloedema , associated with increased intracranial pressure. She has a long standing anemia history    Dx :  IIH at [facility] : Sees Dr Doree Albee, MD   INTERVAL Hx: Sharon Stone is a 32 y.o. 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  04/25/22 She had MRI brain/MRV head with and without contrast IMPRESSION:  1. 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 2. No dural venous sinus thrombosis or specific venous signs of IIH.  She had LP 06/05/22 with OP of 35cm water normal contents. Improvement in headache. Started on Diamox.      Epworth sleepiness score: 10 /24. FSS at   BMI:  58 kg/m   Neck Circumference: 17"   FINDINGS:   Sleep Summary:   Total Recording Time (hours, min): 6 hours 22 minutes       Total Sleep Time (hours, min):   5 hours 17 minutes              Percent REM (%):    30.4%     Sleep latency was 7 minutes and REM sleep latency 108 minutes.  The patient was awake for 58 minutes after sleep onset                                 Respiratory Indices:   Calculated pAHI (per hour): 20.3/h                           REM pAHI:   32/h                                              NREM pAHI:    14.7/h                           Positional AHI:   The patient slept mostly in prone position for 233 minutes at night associated with an 8H RI of 20.4/h.  She slept 51 minutes on her left side with an AHI of 14.5/h she slept 27 minutes supine and here the AHI was 35/h.  Snoring statistics show a mean volume of 41 dB present for only about 10% of total sleep time.                                                Oxygen  Saturation Statistics:     O2 Saturation Range (%):      Between the nadir at 87% of the maximal saturation of 100% with a mean saturation of 93%                                 O2 Saturation (minutes) <89%:     0.2 minutes      Pulse Rate Statistics:   Pulse Mean (bpm): 88 bpm                Pulse Range:   Between 69 and 121 bpm              IMPRESSION:  This HST confirms the presence of moderate obstructive sleep apnea without any evidence of central apneic events.  Following the AASM criteria her sleep apnea was significantly higher in REM sleep than in non-REM sleep which is most likely an obesity associated finding.     RECOMMENDATION: REM sleep dependent apnea benefits most from positive airway pressure.  In addition weight loss medically guided on medication supported is recommended. This patient did not produce prolonged hypoxic events, she is unlikely to retain CO2.. I will order an auto titrating CPAP device with a setting between 5 and 20 cm water pressure with 3 cm EPR, heated humidification and a nasal interface.  Compliance is defined as 4 hours or more of nightly use. A follow-up will be scheduled between 60 and 89 days of CPAP therapy.  At that time we will also reevaluate her sleepiness and fatigue score and the frequency of nocturia.    INTERPRETING PHYSICIAN:   Melvyn Novas, MD

## 2022-09-10 ENCOUNTER — Encounter: Payer: Self-pay | Admitting: Physician Assistant

## 2022-09-10 NOTE — Telephone Encounter (Signed)
Please see patient message and advise.

## 2022-09-17 NOTE — Telephone Encounter (Signed)
Please see patient form/letter required by [facility] and advise

## 2022-09-18 NOTE — Telephone Encounter (Signed)
Received and printed, thank you!

## 2022-09-18 NOTE — Telephone Encounter (Signed)
Hi Whitney, just wanted to let you know I haven't received an email yet. Please send to [email or web address] or fax [phone] Attn Ahmarion Saraceno. Thanks

## 2022-09-18 NOTE — Telephone Encounter (Signed)
Thank you Sharon Stone, you can email to [email or web address] and I will print for Alyssa

## 2022-09-22 ENCOUNTER — Telehealth: Payer: Self-pay | Admitting: Neurology

## 2022-09-22 ENCOUNTER — Encounter: Payer: Self-pay | Admitting: Neurology

## 2022-09-22 NOTE — Telephone Encounter (Signed)
Called pt. No answer at this time. LVM for the patient to call back. Will send a [facility] message

## 2022-09-22 NOTE — Procedures (Signed)
[facility]  Sharon Stone 32 year old female 1990-10-23   HOME SLEEP TEST REPORT ( by Watch PAT)   STUDY DATE:  09-04-2022 DOB: 10-04-1990     ORDERING CLINICIAN: Melvyn Novas, MD  REFERRING CLINICIAN: Allwardt, PA    CLINICAL INFORMATION/HISTORY:Super Obesity, Papilloedema patient, excessive daytime sleepiness, at risk for sleep apnea. Nocturia up to 4 times, smoker.  Sharon Stone is a 32 y.o. female patient who is seen upon referral on 07/17/2022 from Dr Doloris Hall, PA- [facility]. Both suggested a sleep study for snoring and as contributing factor to headache.  This patient has been diagnosed with papilloedema , associated with increased intracranial pressure. She has a long standing anemia history    Dx :  IIH at [facility] : Sees Dr Doree Albee, MD   INTERVAL Hx: Sharon Stone is a 32 y.o. 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  04/25/22 She had MRI brain/MRV head with and without contrast IMPRESSION:  1. 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 2. No dural venous sinus thrombosis or specific venous signs of IIH.  She had LP 06/05/22 with OP of 35cm water normal contents. Improvement in headache. Started on Diamox.      Epworth sleepiness score: 10 /24. FSS at   BMI:  58 kg/m   Neck Circumference: 17"   FINDINGS:   Sleep Summary:   Total Recording Time (hours, min): 6 hours 22 minutes       Total Sleep Time (hours, min):   5 hours 17 minutes              Percent REM (%):    30.4%     Sleep latency was 7 minutes and REM sleep latency 108 minutes.  The patient was awake for 58 minutes after sleep onset                                 Respiratory Indices:   Calculated pAHI (per hour): 20.3/h                           REM pAHI:   32/h                                              NREM pAHI:    14.7/h                           Positional AHI:   The patient slept mostly in prone position for 233 minutes at night associated with an 8H RI of 20.4/h.  She slept 51 minutes on her left side with an AHI of 14.5/h she slept 27 minutes supine and here the AHI was 35/h.  Snoring statistics show a mean volume of 41 dB present for only about 10% of total sleep time.                                                Oxygen Saturation Statistics:  O2 Saturation Range (%):      Between the nadir at 87% of the maximal saturation of 100% with a mean saturation of 93%                                 O2 Saturation (minutes) <89%:     0.2 minutes      Pulse Rate Statistics:   Pulse Mean (bpm): 88 bpm                Pulse Range:   Between 69 and 121 bpm              IMPRESSION:  This HST confirms the presence of moderate obstructive sleep apnea without any evidence of central apneic events.  Following the AASM criteria her sleep apnea was significantly higher in REM sleep than in non-REM sleep which is most likely an obesity associated finding.     RECOMMENDATION: REM sleep dependent apnea benefits most from positive airway pressure.  In addition weight loss medically guided on medication supported is recommended. This patient did not produce prolonged hypoxic events, she is unlikely to retain CO2.. I will order an auto titrating CPAP device with a setting between 5 and 20 cm water pressure with 3 cm EPR, heated humidification and a nasal interface.  Compliance is defined as 4 hours or more of nightly use. A follow-up will be scheduled between 60 and 89 days of CPAP therapy.  At that time we will also reevaluate her sleepiness and fatigue score and the frequency of nocturia.    INTERPRETING PHYSICIAN:   Melvyn Novas, MD

## 2022-09-22 NOTE — Telephone Encounter (Signed)
-----   Message from Greenleaf Dohmeier sent at 09/22/2022  8:48 AM EDT ----- This HST confirms the presence of moderate obstructive sleep apnea without any evidence of central apneic events.  Following the AASM criteria her sleep apnea was significantly higher in REM sleep than in non-REM sleep which is most likely an obesity associated finding.       RECOMMENDATION: REM sleep dependent apnea benefits most from positive airway pressure.  In addition , weight loss,  medically/ surgically guided or medication supported, is recommended.  This patient did not produce prolonged hypoxic events, she is unlikely to retain CO2.  I will order an auto-titrating ResMed CPAP device with a setting between 5 and 20 cm water pressure with 3 cm EPR, heated humidification and a nasal interface.

## 2022-10-03 DIAGNOSIS — G4733 Obstructive sleep apnea (adult) (pediatric): Secondary | ICD-10-CM | POA: Diagnosis not present

## 2022-10-28 DIAGNOSIS — E569 Vitamin deficiency, unspecified: Secondary | ICD-10-CM | POA: Diagnosis not present

## 2022-10-28 DIAGNOSIS — H47293 Other optic atrophy, bilateral: Secondary | ICD-10-CM | POA: Diagnosis not present

## 2022-10-28 DIAGNOSIS — H4711 Papilledema associated with increased intracranial pressure: Secondary | ICD-10-CM | POA: Diagnosis not present

## 2022-11-02 DIAGNOSIS — G4733 Obstructive sleep apnea (adult) (pediatric): Secondary | ICD-10-CM | POA: Diagnosis not present

## 2022-11-07 DIAGNOSIS — E639 Nutritional deficiency, unspecified: Secondary | ICD-10-CM | POA: Diagnosis not present

## 2022-11-13 ENCOUNTER — Other Ambulatory Visit: Payer: Self-pay | Admitting: Physician Assistant

## 2022-11-13 ENCOUNTER — Encounter: Payer: Self-pay | Admitting: Physician Assistant

## 2022-11-21 ENCOUNTER — Encounter: Payer: Commercial Managed Care - PPO | Admitting: Physician Assistant

## 2022-12-03 DIAGNOSIS — G4733 Obstructive sleep apnea (adult) (pediatric): Secondary | ICD-10-CM | POA: Diagnosis not present

## 2022-12-09 ENCOUNTER — Ambulatory Visit (INDEPENDENT_AMBULATORY_CARE_PROVIDER_SITE_OTHER): Payer: Commercial Managed Care - PPO | Admitting: Physician Assistant

## 2022-12-09 DIAGNOSIS — K219 Gastro-esophageal reflux disease without esophagitis: Secondary | ICD-10-CM

## 2022-12-09 DIAGNOSIS — Z131 Encounter for screening for diabetes mellitus: Secondary | ICD-10-CM

## 2022-12-09 DIAGNOSIS — Z6841 Body Mass Index (BMI) 40.0 and over, adult: Secondary | ICD-10-CM

## 2022-12-09 DIAGNOSIS — J4520 Mild intermittent asthma, uncomplicated: Secondary | ICD-10-CM | POA: Diagnosis not present

## 2022-12-09 DIAGNOSIS — D509 Iron deficiency anemia, unspecified: Secondary | ICD-10-CM | POA: Diagnosis not present

## 2022-12-09 DIAGNOSIS — Z720 Tobacco use: Secondary | ICD-10-CM | POA: Diagnosis not present

## 2022-12-09 LAB — POCT GLYCOSYLATED HEMOGLOBIN (HGB A1C): Hemoglobin A1C: 5.3 % (ref 4.0–5.6)

## 2022-12-09 MED ORDER — AIRSUPRA 90-80 MCG/ACT IN AERO
2.0000 | INHALATION_SPRAY | Freq: Four times a day (QID) | RESPIRATORY_TRACT | 2 refills | Status: DC | PRN
Start: 2022-12-09 — End: 2023-08-06

## 2022-12-09 MED ORDER — SEMAGLUTIDE-WEIGHT MANAGEMENT 0.25 MG/0.5ML ~~LOC~~ SOAJ
0.2500 mg | SUBCUTANEOUS | 0 refills | Status: DC
Start: 2022-12-09 — End: 2022-12-16

## 2022-12-09 NOTE — Patient Instructions (Addendum)
VISIT SUMMARY:  During today's visit, we discussed your ongoing challenges with weight loss, low iron levels, asthma, and smoking. We also reviewed your current lifestyle changes and efforts to improve your diet and physical activity. We have developed a plan to address each of these issues and provided recommendations for further management.  YOUR PLAN:  -IRON DEFICIENCY ANEMIA: Iron deficiency anemia is a condition where your body lacks enough iron to produce healthy red blood cells. We recommend scheduling an appointment with your gynecologist to discuss your heavy menstrual bleeding and its impact on your anemia. We may consider iron infusions as an alternative to oral supplements if you continue to have difficulty tolerating them.  -OBESITY: Obesity is a condition characterized by excessive body fat. We will start you on Wegovy 0.25mg  once weekly, with plans to gradually increase the dose. Please continue with your lifestyle modifications, including diet changes and increased physical activity. Consider joining a local gym with a pool for low-impact exercise.  -ASTHMA: Asthma is a condition where your airways narrow and swell, causing difficulty in breathing. We have prescribed an AirDuo RespiClick inhaler for you to use as a rescue inhaler during flare-ups.  -SMOKING: Smoking is harmful to your health and can complicate other conditions, especially if you are considering bariatric surgery. We encourage you to continue your efforts to quit smoking.   -GENERAL HEALTH MAINTENANCE: We will check your A1C to screen for diabetes. Additionally, we encourage you to reduce your consumption of sugary drinks to decrease calorie intake and improve acid reflux symptoms.

## 2022-12-09 NOTE — Progress Notes (Signed)
Patient ID: Sharon Stone, female    DOB: 24-Jul-1990, 32 y.o.   MRN: [id number]   Assessment & Plan:  Morbid obesity due to excess calories (HCC) -     Semaglutide-Weight Management; Inject 0.25 mg into the skin once a week.  Dispense: 2 mL; Refill: 0  BMI 50.0-59.9, adult (HCC)  Screening for diabetes mellitus -     POCT glycosylated hemoglobin (Hb A1C)  Gastroesophageal reflux disease without esophagitis  Tobacco abuse  Mild intermittent asthma without complication -     Airsupra; Inhale 2 puffs into the lungs every 6 (six) hours as needed.  Dispense: 10.7 g; Refill: 2  Iron deficiency anemia, unspecified iron deficiency anemia type    Assessment and Plan    Iron Deficiency Anemia Chronic anemia with hemoglobin consistently in the 9-10 range. Patient has heavy menstrual bleeding and is taking iron supplements, which she finds difficult to tolerate. -Schedule appointment with gynecologist to discuss menstrual bleeding and its impact on anemia. -Consider iron infusions as an alternative to oral supplements.   Obesity Patient has tried multiple diets and lifestyle changes with limited success. She is considering bariatric surgery and is open to trying weight loss medications. -Start Wegovy 0.25mg  once weekly, with plans to increase dose gradually. -Encourage continued lifestyle modifications, including diet changes and increased physical activity. -Consider joining a local gym with a pool for low-impact exercise. -I will complete forms for her surgery.  Asthma Patient reports increased symptoms with seasonal changes. -Prescribe AirDuo RespiClick inhaler for use as a rescue inhaler.  Smoking Patient is currently smoking and is aware that she needs to quit, especially if considering bariatric surgery. -Encourage continued efforts to quit smoking.   General Health Maintenance -Check A1C to screen for diabetes. -Encourage patient to reduce consumption of sugary  drinks to decrease calorie intake and improve acid reflux symptoms.       Subjective:    Chief Complaint  Patient presents with   Medical Management of Chronic Issues    Forms to be filled out. Patient states also having bad heart burn and has only been taking tums and just water. Wants to know if she's able to get another inhaler or will she need another appointment    HPI Patient is in today for a few concerns.   Previous wt loss plans:  Keto diet - couldn't stay with it Weight Watchers - 6 lb loss, 3.5 month plan  Noom - 15 lb loss, 6 month plan  [facility] - [name] - since March 2024, 4 lb loss  Wellbutrin (mostly for smoking) - no success   Currently trying to walk more a few times per week, sometimes limited by knee pain. Still has issues with snacking. Making some slow food substitution changes. Still drinking Mt. Dew, last one 2 days ago.      Discussed the use of AI scribe software for clinical note transcription with the patient, who gave verbal consent to proceed.  History of Present Illness   The patient, a [employer] employee, presents with ongoing struggles with weight loss. She has tried various diets, including calorie deficit, keto, Weight Watchers, and Noom, with limited success. She has also been involved with [facility] since March 2020, with a modest weight loss of about four pounds. The patient has not tried weight loss medications yet but is open to trying them. She is also considering bariatric surgery and is in the process of starting the procedure with [employer]'s  bariatric program.   The patient has asthma and has been without an inhaler for a while. She reports experiencing flare-ups, particularly with the changing seasons. She also admits to being a smoker and is trying to quit, acknowledging that she needs to stop smoking before undergoing bariatric surgery.  In terms of lifestyle changes, the patient has been making  efforts to improve her diet by cutting out snacks, eating more fresh vegetables, and reducing her intake of [facility]. She also tries to stay active by walking and playing baseball with her son.       Past Medical History:  Diagnosis Date   Allergy    Anxiety    Asthma    Depression    GERD (gastroesophageal reflux disease)    Health care maintenance 04/23/2022   Multiple food allergies     Past Surgical History:  Procedure Laterality Date   CESAREAN SECTION N/A 12/02/2014   Procedure: CESAREAN SECTION;  Surgeon: Tilda Burrow, MD;  Location: [facility];  Service: Obstetrics;  Laterality: N/A;   WISDOM TOOTH EXTRACTION  2010    Family History  Problem Relation Age of Onset   Cancer Mother    Depression Mother    Anxiety disorder Mother    Bipolar disorder Mother    Drug abuse Mother    Heart disease Maternal Grandmother    Diabetes Maternal Grandmother    Breast cancer Maternal Grandmother    Diabetes Maternal Aunt     Social History   Tobacco Use   Smoking status: Every Day    Current packs/day: 0.25    Types: Cigarettes   Smokeless tobacco: Current  Vaping Use   Vaping status: Never Used  Substance Use Topics   Alcohol use: Yes   Drug use: Yes    Types: Marijuana    Comment: pt describes herself as "pothead"     Allergies  Allergen Reactions   Shellfish Allergy Anaphylaxis    Review of Systems NEGATIVE UNLESS OTHERWISE INDICATED IN HPI      Objective:     BP (!) 134/92 (BP Location: Right Arm, Patient Position: Sitting, Cuff Size: Normal)   Pulse 97   Temp 97.7 F (36.5 C) (Temporal)   Ht 5\' 5"  (1.651 m)   Wt (!) 344 lb 12.8 oz (156.4 kg)   SpO2 98%   BMI 57.38 kg/m   Wt Readings from Last 3 Encounters:  12/09/22 (!) 344 lb 12.8 oz (156.4 kg)  08/14/22 (!) 345 lb 6.4 oz (156.7 kg)  07/17/22 (!) 345 lb (156.5 kg)    BP Readings from Last 3 Encounters:  12/09/22 (!) 134/92  08/14/22 111/76  07/17/22 129/89     Physical Exam Vitals  and nursing note reviewed.  Constitutional:      Appearance: Normal appearance. She is obese.  Eyes:     Extraocular Movements: Extraocular movements intact.     Conjunctiva/sclera: Conjunctivae normal.     Pupils: Pupils are equal, round, and reactive to light.  Cardiovascular:     Rate and Rhythm: Normal rate and regular rhythm.  Pulmonary:     Effort: Pulmonary effort is normal.     Breath sounds: Normal breath sounds.  Neurological:     General: No focal deficit present.     Mental Status: She is alert and oriented to person, place, and time.  Psychiatric:        Mood and Affect: Mood normal.        Behavior: Behavior normal.  Thought Content: Thought content normal.           Time Spent: 35 minutes of total time was spent on the date of the encounter performing the following actions: chart review prior to seeing the patient, obtaining history, performing a medically necessary exam, counseling on the treatment plan, placing orders, and documenting in our EHR.       Jama Krichbaum M Errik Mitchelle, PA-C

## 2022-12-11 ENCOUNTER — Encounter: Payer: Self-pay | Admitting: Physician Assistant

## 2022-12-11 ENCOUNTER — Other Ambulatory Visit: Payer: Self-pay

## 2022-12-11 MED ORDER — ALBUTEROL SULFATE (2.5 MG/3ML) 0.083% IN NEBU: 2.5000 mg | INHALATION_SOLUTION | Freq: Four times a day (QID) | RESPIRATORY_TRACT | 1 refills | Status: DC | PRN

## 2022-12-11 NOTE — Telephone Encounter (Signed)
Please advise on patient PA for [facility]; RX for inhaler sent to pt pharmacy

## 2022-12-11 NOTE — Telephone Encounter (Signed)
Please see pt msg and advise 

## 2022-12-15 ENCOUNTER — Telehealth: Payer: Self-pay

## 2022-12-15 ENCOUNTER — Encounter: Payer: Self-pay | Admitting: Physician Assistant

## 2022-12-15 ENCOUNTER — Other Ambulatory Visit: Payer: Self-pay

## 2022-12-15 ENCOUNTER — Other Ambulatory Visit (HOSPITAL_COMMUNITY): Payer: Self-pay

## 2022-12-15 MED ORDER — ALBUTEROL SULFATE HFA 108 (90 BASE) MCG/ACT IN AERS: 2.0000 | INHALATION_SPRAY | Freq: Four times a day (QID) | RESPIRATORY_TRACT | 2 refills | Status: AC | PRN

## 2022-12-15 NOTE — Telephone Encounter (Signed)
Pharmacy Patient Advocate Encounter  Received notification from [facility] that Prior Authorization for Wegovy 0.25mg /0.18ml has been APPROVED from 12/15/22 to 06/13/23   PA #/Case ID/Reference #: [id number]  Approval letter indexed to media tab.

## 2022-12-15 NOTE — Telephone Encounter (Signed)
Pharmacy Patient Advocate Encounter   Received notification from Patient Advice Request messages that prior authorization for [facility] 0.25MG /0.5ML auto-injectors is required/requested.   Insurance verification completed.   The patient is insured through [facility] .   Per test claim: PA required; PA submitted to above mentioned insurance via CoverMyMeds Key/confirmation #/EOC [id number] Status is pending

## 2022-12-16 ENCOUNTER — Other Ambulatory Visit: Payer: Self-pay

## 2022-12-16 ENCOUNTER — Other Ambulatory Visit (HOSPITAL_COMMUNITY): Payer: Self-pay

## 2022-12-16 MED ORDER — SEMAGLUTIDE-WEIGHT MANAGEMENT 0.25 MG/0.5ML ~~LOC~~ SOAJ
0.2500 mg | SUBCUTANEOUS | 0 refills | Status: DC
  Filled 2022-12-16: qty 2, 28d supply, fill #0

## 2022-12-16 NOTE — Telephone Encounter (Signed)
Pt called back and would like the bottom RX to be sent to [facility]

## 2022-12-16 NOTE — Telephone Encounter (Signed)
Called pt to advise PA approved

## 2023-01-02 DIAGNOSIS — G4733 Obstructive sleep apnea (adult) (pediatric): Secondary | ICD-10-CM | POA: Diagnosis not present

## 2023-01-12 ENCOUNTER — Encounter: Payer: Self-pay | Admitting: Physician Assistant

## 2023-01-12 NOTE — Telephone Encounter (Signed)
Please see thread as FYI

## 2023-01-23 ENCOUNTER — Telehealth: Payer: Commercial Managed Care - PPO

## 2023-01-23 ENCOUNTER — Telehealth (INDEPENDENT_AMBULATORY_CARE_PROVIDER_SITE_OTHER): Payer: Commercial Managed Care - PPO | Admitting: Physician Assistant

## 2023-01-23 ENCOUNTER — Other Ambulatory Visit (HOSPITAL_COMMUNITY): Payer: Self-pay

## 2023-01-23 DIAGNOSIS — Z6841 Body Mass Index (BMI) 40.0 and over, adult: Secondary | ICD-10-CM

## 2023-01-23 DIAGNOSIS — K219 Gastro-esophageal reflux disease without esophagitis: Secondary | ICD-10-CM

## 2023-01-23 MED ORDER — WEGOVY 0.5 MG/0.5ML ~~LOC~~ SOAJ
0.5000 mg | SUBCUTANEOUS | 1 refills | Status: DC
  Filled 2023-01-23: qty 2, 28d supply, fill #0

## 2023-01-23 NOTE — Progress Notes (Signed)
   Virtual Visit via Video Note  I connected with  Sharon Stone  on 01/23/23 at  1:30 PM EST by a video enabled telemedicine application and verified that I am speaking with the correct person using two identifiers.  Location: Patient: home Provider: Nature conservation officer at [employer] Persons present: Patient and myself   I discussed the limitations of evaluation and management by telemedicine and the availability of in person appointments. The patient expressed understanding and agreed to proceed.   History of Present Illness:  Discussed the use of AI scribe software for clinical note transcription with the patient, who gave verbal consent to proceed.  History of Present Illness   The patient, with a history of obesity, presents for a follow-up after starting [employer]. She reports a mild side effect of nausea, but overall, the medication has been well-tolerated. She has noticed a significant decrease in appetite since starting the medication. She has had no issues obtaining the medication, which was covered after a prior authorization. The patient is considering bariatric surgery but is currently undecided, as she is waiting to see the full effects of the [facility] treatment. She has not yet quantified her weight loss since starting the medication but plans to do so soon.  In addition to the medication, the patient has been making lifestyle changes to aid in weight loss. She has increased her physical activity, including more walking and following along with dance routines found online. She has also made dietary changes, including increasing water intake and reducing consumption of sweets and soda.        Observations/Objective:   Gen: Awake, alert, no acute distress Resp: Breathing is even and non-labored Psych: calm/pleasant demeanor Neuro: Alert and Oriented x 3, + facial symmetry, speech is clear.   Assessment and Plan:  Assessment and Plan    Obesity Started  WGNFAO with mild nausea as a side effect. Noted decreased appetite and reduced cravings for sweets. Patient is considering bariatric surgery but is currently undecided. -Increase Wegovy to 0.5mg . -Continue monitoring for side effects and effectiveness. -Complete paperwork for [facility] for potential bariatric surgery consultation. -Check weight to assess response to [facility].  Lifestyle Modification Patient has increased physical activity, including walking and dance exercises, and has improved dietary habits, including increased water intake and reduced consumption of sweets and soda. -Encourage continuation of these lifestyle modifications. -Schedule follow-up appointment on January 24th.        Follow Up Instructions:    I discussed the assessment and treatment plan with the patient. The patient was provided an opportunity to ask questions and all were answered. The patient agreed with the plan and demonstrated an understanding of the instructions.   The patient was advised to call back or seek an in-person evaluation if the symptoms worsen or if the condition fails to improve as anticipated.  Vidhi Delellis M Ramona Slinger, PA-C

## 2023-01-24 ENCOUNTER — Telehealth: Payer: Commercial Managed Care - PPO

## 2023-02-02 DIAGNOSIS — G4733 Obstructive sleep apnea (adult) (pediatric): Secondary | ICD-10-CM | POA: Diagnosis not present

## 2023-02-09 ENCOUNTER — Telehealth: Payer: Commercial Managed Care - PPO | Admitting: Physician Assistant

## 2023-02-09 DIAGNOSIS — F17210 Nicotine dependence, cigarettes, uncomplicated: Secondary | ICD-10-CM

## 2023-02-09 DIAGNOSIS — Z716 Tobacco abuse counseling: Secondary | ICD-10-CM | POA: Diagnosis not present

## 2023-02-09 DIAGNOSIS — Z720 Tobacco use: Secondary | ICD-10-CM | POA: Diagnosis not present

## 2023-02-09 MED ORDER — NICOTINE 21 MG/24HR TD PT24
21.0000 mg | MEDICATED_PATCH | Freq: Every day | TRANSDERMAL | 1 refills | Status: DC
Start: 2023-02-09 — End: 2023-02-11

## 2023-02-09 NOTE — Patient Instructions (Addendum)
 Travis Needle, thank you for joining Delon CHRISTELLA Dickinson, PA-C for today's virtual visit.  While this provider is not your primary care provider (PCP), if your PCP is located in our provider database this encounter information will be shared with them immediately following your visit.  Consent: (Patient) Carlen Rebuck provided verbal consent for this virtual visit at the beginning of the encounter.  Current Medications:  Current Outpatient Medications:    albuterol  (VENTOLIN  HFA) 108 (90 Base) MCG/ACT inhaler, Inhale 2 puffs into the lungs every 6 (six) hours as needed for wheezing or shortness of breath., Disp: 8 g, Rfl: 2   nicotine  (NICODERM CQ  - DOSED IN MG/24 HOURS) 21 mg/24hr patch, Place 1 patch (21 mg total) onto the skin daily., Disp: 28 patch, Rfl: 1   acetaminophen  (TYLENOL ) 500 MG tablet, Take 1,000 mg by mouth every 6 (six) hours as needed for headache., Disp: , Rfl:    Albuterol -Budesonide  (AIRSUPRA ) 90-80 MCG/ACT AERO, Inhale 2 puffs into the lungs every 6 (six) hours as needed., Disp: 10.7 g, Rfl: 2   Cyanocobalamin  (B-12 COMPLIANCE INJECTION) 1000 MCG/ML KIT, Inject 1,000 mcg as directed once a week., Disp: 4 kit, Rfl: 1   ibuprofen  (ADVIL ,MOTRIN ) 600 MG tablet, Take 1 tablet (600 mg total) by mouth every 6 (six) hours as needed., Disp: 30 tablet, Rfl: 0   Semaglutide -Weight Management (WEGOVY ) 0.5 MG/0.5ML SOAJ, Inject 0.5 mg into the skin once a week., Disp: 2 mL, Rfl: 1   Semaglutide -Weight Management 0.25 MG/0.5ML SOAJ, Inject 0.25 mg into the skin once a week., Disp: 2 mL, Rfl: 0   Vitamin D , Ergocalciferol , (DRISDOL ) 1.25 MG (50000 UNIT) CAPS capsule, Take 1 capsule (50,000 Units total) by mouth every 7 (seven) days., Disp: 12 capsule, Rfl: 0   When you are getting low on your smoking cessation medications, schedule your next video appointment as previously discussed. This way we can follow-up with you, make any necessary adjustments/changes, and get next fill of  medication sent in to your pharmacy.   Follow-Up: Call back or seek an in-person evaluation if the symptoms worsen or if the condition fails to improve as anticipated.  Other Instructions  Congratulations for your interest in quitting smoking!  Quitting smoking is one of the most important things you can do to protect your health.  We are here to help you! Did you know that if you quit smoking 1 pack per day you could save up to $2550.00 per year?  Medications are not appropriate for everyone depending upon your situation and any health issues you may have. If we do prescribe medication, it will be for 1 month at time and you will be required to have a follow up Video visit at 1 month to assess how you are doing and if there are any side effects.  This could be for up to a total of 3 months.    Support from your friends, family and work colleagues is very important. Please let them know that you are trying to stop smoking so that they understand your need for support in this goal!  - Remove tobacco products from your environment - Set a quit date ideally within 2 weeks. - You should totally abstain from smoking after your quit date. A single puff could hurt your progress or cause you to relapse - If there are others in your household that smoke, ask them to try to quit or abstain from smoking in your presence  You may notice nicotine  withdrawal symptoms such  as increased appetite and weight gain, changes in mood, insomnia, irritability and/or anxiety. These symptoms peak in the first three days after smoking cessation and subside over the next 3-4 weeks.   I have prescribed Nicotine  patches for you. Apply the patch to a non-hairy skin site and rotate the site daily. The starting dose depends on how many cigarettes per day you were currently at before starting treatment. Because you were smoking > 10 cigarettes per day, I have prescribed a 21 mg patch for you to apply daily for 6 weeks. Then we  will plan to decrease to a 14 mg patch daily for 2 weeks, followed by a 7 mg patch daily for 2 more weeks.  We will discuss again in detail at your follow-up visit!  A combination of behavioral and medication can improve the success of you quitting smoking. You may want to use the 1-800-QUIT-NOW free support line.  Also, the Department of Health and Human Services provides Smoke free apps for smartphones: sharedcustomer.fi  If you happen to break your plan and have a cigarette, keep taking your medications and continue to try to abstain.  Do not give up!  MAKE SURE YOU  Take any prescribed medications only as instructed.  If you miss a dose of medication, take the next dose when it is due and get back on schedule. DO NOT double up on medications.  Mark your calendar to do your Follow Up Smoking Cessation Visit in one month   If you have been instructed to have an in-person evaluation today at a local Urgent Care facility, please use the link below. It will take you to a list of all of our available [facility], including address, phone number and hours of operation. Please do not delay care.  [facility]  If you or a family member do not have a primary care provider, use the link below to schedule a visit and establish care. When you choose a [facility] primary care physician or advanced practice provider, you gain a long-term partner in health. Find a Primary Care Provider  Learn more about [facility]'s in-office and virtual care options: [facility] - Get Care Now

## 2023-02-09 NOTE — Progress Notes (Signed)
 Virtual Visit Consent   Sharon Stone, you are scheduled for a virtual visit with a [facility] provider today. Just as with appointments in the office, your consent must be obtained to participate. Your consent will be active for this visit and any virtual visit you may have with one of our providers in the next 365 days. If you have a MyChart account, a copy of this consent can be sent to you electronically.  As this is a virtual visit, video technology does not allow for your provider to perform a traditional examination. This may limit your provider's ability to fully assess your condition. If your provider identifies any concerns that need to be evaluated in person or the need to arrange testing (such as labs, EKG, etc.), we will make arrangements to do so. Although advances in technology are sophisticated, we cannot ensure that it will always work on either your end or our end. If the connection with a video visit is poor, the visit may have to be switched to a telephone visit. With either a video or telephone visit, we are not always able to ensure that we have a secure connection.  By engaging in this virtual visit, you consent to the provision of healthcare and authorize for your insurance to be billed (if applicable) for the services provided during this visit. Depending on your insurance coverage, you may receive a charge related to this service.  I need to obtain your verbal consent now. Are you willing to proceed with your visit today? Pavneet Riviere has provided verbal consent on 02/09/2023 for a virtual visit (video or telephone). Sharon CHRISTELLA Dickinson, PA-C  Date: 02/09/2023 6:26 PM  Virtual Visit via Video Note   I, Sharon Stone, connected with  Sharon Stone  ([id number], [birth year]) on 02/09/23 at  5:45 PM EST by a video-enabled telemedicine application and verified that I am speaking with the correct person using two identifiers.  Location: Patient: Virtual Visit  Location Patient: Home Provider: Virtual Visit Location Provider: Home Office   I discussed the limitations of evaluation and management by telemedicine and the availability of in person appointments. The patient expressed understanding and agreed to proceed.    History of Present Illness: Sharon Stone is wanting to discuss their tobacco use and is seeking assistance with cessation. Patient has been smoking/using around 4 cigars per day for the past 1 years. There has not been prior attempts to quit without success. Smoking black and mild cigars and up to 4 daily. Started when she was 33 years old with occasional use, but escalated to 4 daily over the last year. No other tobacco products and no other nicotine .   Triggers: The following trigger(s) for use has/have been identified: psychological: social, work, stressors  Withdrawal Symptoms: Identified withdrawal symptoms: none.  State of Readiness: Patient feels overall Ready to quit. Concerns/Barriers to Cessation - None  Observations/Objective: Patient is well-developed, well-nourished in no acute distress.  Resting comfortably at home.  Head is normocephalic, atraumatic.  No labored breathing.  Speech is clear and coherent with logical content.  Patient is alert and oriented at baseline.    Assessment and Plan: There are no diagnoses linked to this encounter.  - Patient is currently at the following State of Change: Action -- involved in a quit attempt - Comorbid conditions identified: h/o gestational HTN, IIH, h/o gestational thrombocytopenia, Morbid obesity, DOE - Reviewed impacts of smoking on patient's health. - Quit date set for 2 weeks from today. - I have  prescribed Nicotine  patches.  The patient is to apply the patch to a non-hairy skin site and rotate the site daily. Will begin with starting dose of Because you were smoking > 10 cigarettes per day, I have prescribed a 21 mg patch for you to apply daily for 6 weeks. Then we  will plan to decrease to a 14 mg patch daily for 2 weeks, followed by a 7 mg patch daily for 2 more weeks.  We will discuss again in detail at your follow-up visit! - Other resources including the [employer] and Department of Health and [employer] -- have been included in the patient's written instructions and sent to their MyChart. - Plan for follow-up in 4 weeks  Follow Up Instructions: I discussed the assessment and treatment plan with the patient. The patient was provided an opportunity to ask questions and all were answered. The patient agreed with the plan and demonstrated an understanding of the instructions.  A copy of instructions were sent to the patient via MyChart unless otherwise noted below.    Time:  I have spent 23 minutes with the patient via telehealth technology in tobacco cessation counseling.    Sharon CHRISTELLA Dickinson, PA-C

## 2023-02-11 ENCOUNTER — Encounter: Payer: Self-pay | Admitting: Physician Assistant

## 2023-02-11 DIAGNOSIS — F17200 Nicotine dependence, unspecified, uncomplicated: Secondary | ICD-10-CM

## 2023-02-11 DIAGNOSIS — Z716 Tobacco abuse counseling: Secondary | ICD-10-CM

## 2023-02-11 MED ORDER — NICOTINE 21 MG/24HR TD PT24: 21.0000 mg | MEDICATED_PATCH | Freq: Every day | TRANSDERMAL | 1 refills | Status: DC

## 2023-02-16 ENCOUNTER — Ambulatory Visit: Payer: Commercial Managed Care - PPO | Admitting: Physician Assistant

## 2023-02-19 ENCOUNTER — Other Ambulatory Visit: Payer: Self-pay

## 2023-02-19 ENCOUNTER — Ambulatory Visit
Admission: RE | Admit: 2023-02-19 | Discharge: 2023-02-19 | Disposition: A | Discharge status: Home / Self Care | Payer: Commercial Managed Care - PPO | Source: Ambulatory Visit

## 2023-02-19 VITALS — BP 115/71 | HR 86 | Temp 98.4°F | Resp 18

## 2023-02-19 DIAGNOSIS — S39012A Strain of muscle, fascia and tendon of lower back, initial encounter: Secondary | ICD-10-CM

## 2023-02-19 MED ORDER — BACLOFEN 10 MG PO TABS: 10.0000 mg | ORAL_TABLET | Freq: Three times a day (TID) | ORAL | 0 refills | Status: DC

## 2023-02-19 NOTE — Discharge Instructions (Signed)
Your pain is likely due to a muscle strain which will improve on its own with time.   - You may take over the counter medicines to help with pain.  - You may also take the prescribed muscle relaxer as directed as needed for muscle aches/spasm.  Do not take this medication and drive or drink alcohol as it can make you sleepy.  Mainly use this medicine at nighttime as needed. - Apply heat 20 minutes on then 20 minutes off and perform gentle range of motion exercises to the area of greatest pain to prevent muscle stiffness and provide further pain relief.   Red flag symptoms to watch out for are numbness/tingling to the legs, weakness, loss of bowel/bladder control, and/or worsening pain that does not respond well to medicines. Follow-up with your primary care provider or return to urgent care if your symptoms do not improve in the next 3 to 4 days with medications and interventions recommended today. If your symptoms are severe (red flag), please go to the emergency room.

## 2023-02-19 NOTE — ED Provider Notes (Signed)
[facility]    CSN: [id number] Arrival date & time: 02/19/23  1159      History   Chief Complaint Chief Complaint  Patient presents with   Back Pain    Pain in lower back - Entered by patient    HPI Sharon Stone is a 33 y.o. female.   Sharon Stone is a 34 y.o. female presenting for chief complaint of low back pain to the mid low back that started yesterday while she was sitting down at work. She works from home at a desk but often times gets up to move around throughout the day. Pain is localized to the mid lumbar spine and sometimes radiates to each side of the L spine. Pain is triggered by movement and improves with rest/heat. No fall, trauma, numbness or tingling, saddle paresthesia, changes to bowel or urinary habits, extremity weakness, radicular symptoms. She has attempted use of tylenol/motrin for pain without much relief.    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 05/15/2022   Insulin resistance 04/24/2022   Other fatigue 04/23/2022   SOB (shortness of breath) on exertion 04/23/2022   Vitamin D deficiency 04/23/2022   Elevated glucose 04/23/2022   Health care maintenance 04/23/2022   Vitamin B 12 deficiency 04/23/2022   Morbid obesity (HCC) 04/23/2022   BMI 50.0-59.9, adult (HCC) 04/23/2022   Cigarette nicotine dependence without complication 02/25/2022   Gastroesophageal reflux disease without esophagitis 02/25/2022   Marijuana  abuse 05/31/2016   History of VBAC 05/31/2016   Gestational thrombocytopenia (HCC) 05/31/2016   Gestational hypertension 05/31/2016   Fetal demise, greater than 22 weeks, antepartum 05/28/2016   Tobacco smoking affecting pregnancy    Morbid obesity due to excess calories [facility])     Past Surgical History:  Procedure Laterality Date   CESAREAN SECTION N/A 12/02/2014   Procedure: CESAREAN SECTION;  Surgeon: Tilda Burrow, MD;  Location: [facility];  Service: Obstetrics;  Laterality: N/A;   WISDOM TOOTH EXTRACTION  2010    OB History     Gravida  3   Para  3   Term  2   Preterm  1   AB      Living  2      SAB      IAB      Ectopic      Multiple  0   Live Births  2            Home Medications    Prior to Admission medications   Medication Sig Start Date End Date Taking? Authorizing Provider  albuterol (VENTOLIN HFA) 108 (90 Base) MCG/ACT inhaler Inhale 2 puffs into the lungs every 6 (six) hours as needed for wheezing or shortness of breath. 12/15/22   Allwardt, Crist Infante, PA-C  baclofen (LIORESAL) 10 MG tablet Take 1 tablet (10 mg total) by mouth 3 (three) times daily. 02/19/23  Yes Carlisle Beers, FNP  folic acid (FOLVITE) 1 MG  tablet Take 1 mg by mouth daily.   Yes [provider]  acetaminophen (TYLENOL) 500 MG tablet Take 1,000 mg by mouth every 6 (six) hours as needed for headache.    [provider]  Albuterol-Budesonide (AIRSUPRA) 90-80 MCG/ACT AERO Inhale 2 puffs into the lungs every 6 (six) hours as needed. 12/09/22   Allwardt, Crist Infante, PA-C  Cyanocobalamin (B-12 COMPLIANCE INJECTION) 1000 MCG/ML KIT Inject 1,000 mcg as directed once a week. 04/23/22   Allwardt, Crist Infante, PA-C  ibuprofen (ADVIL,MOTRIN) 600 MG tablet Take 1 tablet (600 mg total) by mouth every 6 (six) hours as needed. 05/30/16   Annandale Bing, MD  nicotine (NICODERM CQ - DOSED IN MG/24 HOURS) 21 mg/24hr patch Place 1 patch (21 mg total) onto the skin daily. 02/11/23    Margaretann Loveless, PA-C  Semaglutide-Weight Management (WEGOVY) 0.5 MG/0.5ML SOAJ Inject 0.5 mg into the skin once a week. 01/23/23   Allwardt, Crist Infante, PA-C  Semaglutide-Weight Management 0.25 MG/0.5ML SOAJ Inject 0.25 mg into the skin once a week. 12/16/22   Allwardt, Crist Infante, PA-C  Vitamin D, Ergocalciferol, (DRISDOL) 1.25 MG (50000 UNIT) CAPS capsule Take 1 capsule (50,000 Units total) by mouth every 7 (seven) days. 08/19/22   Allwardt, Crist Infante, PA-C    Family History Family History  Problem Relation Age of Onset   Cancer Mother    Depression Mother    Anxiety disorder Mother    Bipolar disorder Mother    Drug abuse Mother    Heart disease Maternal Grandmother    Diabetes Maternal Grandmother    Breast cancer Maternal Grandmother    Diabetes Maternal Aunt     Social History Social History   Tobacco Use   Smoking status: Every Day    Current packs/day: 0.25    Types: Cigarettes   Smokeless tobacco: Current  Vaping Use   Vaping status: Never Used  Substance Use Topics   Alcohol use: Yes   Drug use: Yes    Types: Marijuana    Comment: pt describes herself as "pothead"     Allergies   Shellfish allergy   Review of Systems Review of Systems  Musculoskeletal:  Positive for back pain.  Per HPI   Physical Exam Triage Vital Signs ED Triage Vitals  Encounter Vitals Group     BP 02/19/23 1228 115/71     Systolic BP Percentile --      Diastolic BP Percentile --      Pulse Rate 02/19/23 1228 86     Resp 02/19/23 1228 18     Temp 02/19/23 1228 98.4 F (36.9 C)     Temp Source 02/19/23 1228 Oral     SpO2 02/19/23 1228 96 %     Weight --      Height --      Head Circumference --      Peak Flow --      Pain Score 02/19/23 1225 6     Pain Loc --      Pain Education --      Exclude from Growth Chart --    No data found.  Updated Vital Signs BP 115/71 (BP Location: Right Arm)   Pulse 86   Temp 98.4 F (36.9 C) (Oral)   Resp 18   LMP 01/25/2023    SpO2 96%   Visual Acuity Right Eye Distance:   Left Eye Distance:   Bilateral Distance:    Right Eye Near:   Left Eye Near:  Bilateral Near:     Physical Exam Vitals and nursing note reviewed.  Constitutional:      Appearance: She is not ill-appearing or toxic-appearing.  HENT:     Head: Normocephalic and atraumatic.     Right Ear: Hearing and external ear normal.     Left Ear: Hearing and external ear normal.     Nose: Nose normal.     Mouth/Throat:     Lips: Pink.  Eyes:     General: Lids are normal. Vision grossly intact. Gaze aligned appropriately.     Extraocular Movements: Extraocular movements intact.     Conjunctiva/sclera: Conjunctivae normal.  Cardiovascular:     Rate and Rhythm: Normal rate and regular rhythm.     Heart sounds: Normal heart sounds, S1 normal and S2 normal.  Pulmonary:     Effort: Pulmonary effort is normal. No respiratory distress.     Breath sounds: Normal breath sounds and air entry.  Musculoskeletal:     Cervical back: Normal and neck supple.     Thoracic back: Normal.     Lumbar back: Spasms, tenderness and bony tenderness present. No swelling, edema, deformity, signs of trauma or lacerations. Normal range of motion. Negative right straight leg raise test and negative left straight leg raise test. No scoliosis.     Comments: Tender to palpation to the lumbar spine as well as the bilateral paraspinous muscles of the L-spine.  Strength and sensation intact to bilateral upper and lower extremities (5/5). Moves all 4 extremities with normal coordination voluntarily. Non-focal neuro exam.   Skin:    General: Skin is warm and dry.     Capillary Refill: Capillary refill takes less than 2 seconds.     Findings: No rash.  Neurological:     General: No focal deficit present.     Mental Status: She is alert and oriented to person, place, and time. Mental status is at baseline.     Cranial Nerves: No dysarthria or facial asymmetry.  Psychiatric:         Mood and Affect: Mood normal.        Speech: Speech normal.        Behavior: Behavior normal.        Thought Content: Thought content normal.        Judgment: Judgment normal.      UC Treatments / Results  Labs (all labs ordered are listed, but only abnormal results are displayed) Labs Reviewed - No data to display  EKG   Radiology No results found.  Procedures Procedures (including critical care time)  Medications Ordered in UC Medications - No data to display  Initial Impression / Assessment and Plan / UC Course  I have reviewed the triage vital signs and the nursing notes.  Pertinent labs & imaging results that were available during my care of the patient were reviewed by me and considered in my medical decision making (see chart for details).   1.  Strain of lumbar region Evaluation suggests pain is musculoskeletal in nature. Will manage this with rest, gentle ROM exercises, heat therapy, ibuprofen as needed for pain, and as needed use of muscle relaxer. Drowsiness precautions discussed regarding muscle relaxer use. Imaging: no indication for imaging based on stable musculoskeletal exam findings May follow-up with orthopedics as needed. Work/school excise note given.  Counseled patient on potential for adverse effects with medications prescribed/recommended today, strict ER and return-to-clinic precautions discussed, patient verbalized understanding.    Final Clinical Impressions(s) / UC  Diagnoses   Final diagnoses:  Strain of lumbar region, initial encounter     Discharge Instructions      Your pain is likely due to a muscle strain which will improve on its own with time.   - You may take over the counter medicines to help with pain.  - You may also take the prescribed muscle relaxer as directed as needed for muscle aches/spasm.  Do not take this medication and drive or drink alcohol as it can make you sleepy.  Mainly use this medicine at nighttime as  needed. - Apply heat 20 minutes on then 20 minutes off and perform gentle range of motion exercises to the area of greatest pain to prevent muscle stiffness and provide further pain relief.   Red flag symptoms to watch out for are numbness/tingling to the legs, weakness, loss of bowel/bladder control, and/or worsening pain that does not respond well to medicines. Follow-up with your primary care provider or return to urgent care if your symptoms do not improve in the next 3 to 4 days with medications and interventions recommended today. If your symptoms are severe (red flag), please go to the emergency room.     ED Prescriptions     Medication Sig Dispense Auth. Provider   baclofen (LIORESAL) 10 MG tablet Take 1 tablet (10 mg total) by mouth 3 (three) times daily. 30 each Carlisle Beers, FNP      PDMP not reviewed this encounter.   Carlisle Beers, [place] 02/19/23 1402

## 2023-02-19 NOTE — ED Triage Notes (Signed)
Patient presents to [facility] for evaluation of lower back pain since yesterday while sitting at work yesterday.  Has had tightness before, but never actual constant pain.   Denies any known injury.  Denies numbness in her feet or legs.  Patient took dual tylenol and advil today without much relief.  No changes in bowel or bladder

## 2023-02-20 ENCOUNTER — Other Ambulatory Visit (HOSPITAL_COMMUNITY): Payer: Self-pay

## 2023-02-20 ENCOUNTER — Ambulatory Visit (INDEPENDENT_AMBULATORY_CARE_PROVIDER_SITE_OTHER): Payer: Commercial Managed Care - PPO | Admitting: Physician Assistant

## 2023-02-20 ENCOUNTER — Encounter: Payer: Self-pay | Admitting: Physician Assistant

## 2023-02-20 DIAGNOSIS — E88819 Insulin resistance, unspecified: Secondary | ICD-10-CM

## 2023-02-20 DIAGNOSIS — Z7689 Persons encountering health services in other specified circumstances: Secondary | ICD-10-CM | POA: Diagnosis not present

## 2023-02-20 DIAGNOSIS — Z6841 Body Mass Index (BMI) 40.0 and over, adult: Secondary | ICD-10-CM

## 2023-02-20 MED ORDER — WEGOVY 1 MG/0.5ML ~~LOC~~ SOAJ
1.0000 mg | SUBCUTANEOUS | 1 refills | Status: DC
  Filled 2023-02-20: qty 2, 28d supply, fill #0
  Filled 2023-03-18: qty 2, 28d supply, fill #1

## 2023-02-20 NOTE — Progress Notes (Signed)
Patient ID: Sharon Stone, female    DOB: 1990/08/11, 33 y.o.   MRN: [id number]   Assessment & Plan:  Morbid obesity due to excess calories (HCC) -     OZDGUY; Inject 1 mg into the skin every 7 (seven) days.  Dispense: 2 mL; Refill: 1  Insulin resistance -     Wegovy; Inject 1 mg into the skin every 7 (seven) days.  Dispense: 2 mL; Refill: 1  Encounter for weight management    Obesity Significant weight loss (20 pounds in 2 months) since starting Wegovy. Tolerating medication well with occasional nausea. -Increase Wegovy to 1mg  weekly. -Continue lifestyle modifications including diet and exercise.  Smoking Cessation Recently started on patches. Excited for her journey this year.  -Continue current cessation efforts.  Follow-up Schedule next appointment on the same day as children's appointments in March.        Subjective:    Chief Complaint  Patient presents with   Medical Management of Chronic Issues    Pt in for f/u with PCP and weight check; pt states things are going well and no issues or concerns    HPI Discussed the use of AI scribe software for clinical note transcription with the patient, who gave verbal consent to proceed.  History of Present Illness   The patient, with a history of back pain and obesity, presents for a follow-up visit. They report a recent episode of severe back pain, which was diagnosed as a back spasm and has since improved. They have also started a smoking cessation program.  The patient has been on [facility] for weight loss and reports a positive experience with the medication. They have occasional nausea but find it manageable. They have noticed significant weight loss since starting the medication, losing twenty pounds in two months. The patient reports that the medication has helped them make better dietary choices and has reduced their craving for sweets.  The patient is also engaging in physical activity, including dancing with  their daughter. They report that their children are supportive of their health journeys, including smoking cessation and weight loss.       Past Medical History:  Diagnosis Date   Allergy    Anxiety    Asthma    Depression    GERD (gastroesophageal reflux disease)    Health care maintenance 04/23/2022   Multiple food allergies     Past Surgical History:  Procedure Laterality Date   CESAREAN SECTION N/A 12/02/2014   Procedure: CESAREAN SECTION;  Surgeon: Tilda Burrow, MD;  Location: [facility];  Service: Obstetrics;  Laterality: N/A;   WISDOM TOOTH EXTRACTION  2010    Family History  Problem Relation Age of Onset   Cancer Mother    Depression Mother    Anxiety disorder Mother    Bipolar disorder Mother    Drug abuse Mother    Heart disease Maternal Grandmother    Diabetes Maternal Grandmother    Breast cancer Maternal Grandmother    Diabetes Maternal Aunt     Social History   Tobacco Use   Smoking status: Every Day    Current packs/day: 0.25    Types: Cigarettes   Smokeless tobacco: Current  Vaping Use   Vaping status: Never Used  Substance Use Topics   Alcohol use: Yes   Drug use: Yes    Types: Marijuana    Comment: pt describes herself as "pothead"     Allergies  Allergen Reactions   Shellfish Allergy Anaphylaxis  Review of Systems NEGATIVE UNLESS OTHERWISE INDICATED IN HPI      Objective:     BP 116/76 (BP Location: Left Arm, Patient Position: Sitting, Cuff Size: Large)   Pulse 87   Temp 98.2 F (36.8 C) (Temporal)   Ht 5\' 5"  (1.651 m)   Wt (!) 324 lb (147 kg)   LMP 01/25/2023   SpO2 97%   BMI 53.92 kg/m   Wt Readings from Last 3 Encounters:  02/20/23 (!) 324 lb (147 kg)  12/09/22 (!) 344 lb 12.8 oz (156.4 kg)  08/14/22 (!) 345 lb 6.4 oz (156.7 kg)    BP Readings from Last 3 Encounters:  02/20/23 116/76  02/19/23 115/71  12/09/22 (!) 134/92     Physical Exam Vitals and nursing note reviewed.  Constitutional:      Appearance:  Normal appearance. She is obese.  Eyes:     Extraocular Movements: Extraocular movements intact.     Conjunctiva/sclera: Conjunctivae normal.     Pupils: Pupils are equal, round, and reactive to light.  Cardiovascular:     Rate and Rhythm: Normal rate and regular rhythm.  Pulmonary:     Effort: Pulmonary effort is normal.     Breath sounds: Normal breath sounds.  Neurological:     General: No focal deficit present.     Mental Status: She is alert and oriented to person, place, and time.  Psychiatric:        Mood and Affect: Mood normal.        Behavior: Behavior normal.        Thought Content: Thought content normal.       Giulia Hickey M Hana Trippett, PA-C

## 2023-03-05 DIAGNOSIS — G4733 Obstructive sleep apnea (adult) (pediatric): Secondary | ICD-10-CM | POA: Diagnosis not present

## 2023-04-02 DIAGNOSIS — G4733 Obstructive sleep apnea (adult) (pediatric): Secondary | ICD-10-CM | POA: Diagnosis not present

## 2023-04-03 DIAGNOSIS — G4733 Obstructive sleep apnea (adult) (pediatric): Secondary | ICD-10-CM | POA: Diagnosis not present

## 2023-04-10 ENCOUNTER — Encounter: Payer: Self-pay | Admitting: Physician Assistant

## 2023-04-10 ENCOUNTER — Ambulatory Visit: Payer: Commercial Managed Care - PPO | Admitting: Physician Assistant

## 2023-04-10 ENCOUNTER — Other Ambulatory Visit (HOSPITAL_COMMUNITY): Payer: Self-pay

## 2023-04-10 VITALS — BP 134/86 | HR 96 | Temp 97.0°F | Ht 65.0 in | Wt 307.0 lb

## 2023-04-10 DIAGNOSIS — E88819 Insulin resistance, unspecified: Secondary | ICD-10-CM | POA: Diagnosis not present

## 2023-04-10 DIAGNOSIS — F17200 Nicotine dependence, unspecified, uncomplicated: Secondary | ICD-10-CM

## 2023-04-10 DIAGNOSIS — Z6841 Body Mass Index (BMI) 40.0 and over, adult: Secondary | ICD-10-CM | POA: Diagnosis not present

## 2023-04-10 DIAGNOSIS — Z7689 Persons encountering health services in other specified circumstances: Secondary | ICD-10-CM

## 2023-04-10 MED ORDER — WEGOVY 1 MG/0.5ML ~~LOC~~ SOAJ
1.0000 mg | SUBCUTANEOUS | 2 refills | Status: DC
  Filled 2023-04-10: qty 2, 28d supply, fill #0
  Filled 2023-05-07: qty 2, 28d supply, fill #1
  Filled 2023-06-12: qty 2, 28d supply, fill #2

## 2023-04-10 NOTE — Progress Notes (Signed)
 Patient ID: Sharon Stone, female    DOB: 10-Aug-1990, 33 y.o.   MRN: [id number]   Assessment & Plan:  Encounter for weight management -     Wegovy; Inject 1 mg into the skin every 7 (seven) days.  Dispense: 2 mL; Refill: 2  Insulin resistance -     Wegovy; Inject 1 mg into the skin every 7 (seven) days.  Dispense: 2 mL; Refill: 2  Tobacco dependence  Morbid obesity due to excess calories (HCC) -     WUJWJX; Inject 1 mg into the skin every 7 (seven) days.  Dispense: 2 mL; Refill: 2   Assessment and Plan    Obesity She lost 31 pounds with improved energy. Wegovyat 1 mg stabilized, well-tolerated, aids appetite regulation. - Continue current regimen. - Encourage regular exercise and hydration. - Ensure balanced nutrition with adequate protein and vegetables. - Consider increasing dose to 1.7 mg if needed.  Nicotine Dependence Using nicotine patches, no oral nicotine replacement therapy. - Continue nicotine patches as directed.         Return in about 3 months (around 07/11/2023) for recheck/follow-up.    Subjective:    Chief Complaint  Patient presents with   Medical Management of Chronic Issues    Pt in office for 2 mon f/u; no concerns to discuss;     HPI Discussed the use of AI scribe software for clinical note transcription with the patient, who gave verbal consent to proceed.  History of Present Illness   Sharon Stone is a 33 year old female who presents for a follow-up visit regarding weight management and medication review.  She has successfully lost 31 pounds since last year. She is pleased with the changes and the impact on her daily life.  Her current medication regimen includes a dose of one milligram Wegovy weekly, which initially felt aggressive but has since become well-tolerated. The medication helps regulate her eating habits, preventing overeating and signaling when she has had enough.  She reports increased energy levels, feeling more  energetic throughout the day, and finds it easier to get out of bed. Previously, she would feel exhausted after an hour of activity, but now she feels better overall.  She is incorporating more exercise and water into her routine and is mindful of her nutrition, ensuring she gets all necessary nutrients, including protein and vegetables.  She is using nicotine patches as part of her smoking cessation plan, arranged through virtual urgent care, and plans to follow up with another appointment. She is not using any oral nicotine replacement therapy at this time.       Past Medical History:  Diagnosis Date   Allergy    Anxiety    Asthma    Depression    GERD (gastroesophageal reflux disease)    Health care maintenance 04/23/2022   Multiple food allergies     Past Surgical History:  Procedure Laterality Date   CESAREAN SECTION N/A 12/02/2014   Procedure: CESAREAN SECTION;  Surgeon: Tilda Burrow, MD;  Location: [facility];  Service: Obstetrics;  Laterality: N/A;   WISDOM TOOTH EXTRACTION  2010    Family History  Problem Relation Age of Onset   Cancer Mother    Depression Mother    Anxiety disorder Mother    Bipolar disorder Mother    Drug abuse Mother    Heart disease Maternal Grandmother    Diabetes Maternal Grandmother    Breast cancer Maternal Grandmother    Diabetes Maternal Aunt  Social History   Tobacco Use   Smoking status: Every Day    Current packs/day: 0.25    Types: Cigarettes   Smokeless tobacco: Current  Vaping Use   Vaping status: Never Used  Substance Use Topics   Alcohol use: Yes   Drug use: Yes    Types: Marijuana    Comment: pt describes herself as "pothead"     Allergies  Allergen Reactions   Shellfish Allergy Anaphylaxis    Review of Systems NEGATIVE UNLESS OTHERWISE INDICATED IN HPI      Objective:     BP 134/86 (BP Location: Left Arm, Patient Position: Sitting, Cuff Size: Large)   Pulse 96   Temp (!) 97 F (36.1 C) (Temporal)    Ht 5\' 5"  (1.651 m)   Wt (!) 307 lb (139.3 kg)   SpO2 99%   BMI 51.09 kg/m   Wt Readings from Last 3 Encounters:  04/10/23 (!) 307 lb (139.3 kg)  02/20/23 (!) 324 lb (147 kg)  12/09/22 (!) 344 lb 12.8 oz (156.4 kg)    BP Readings from Last 3 Encounters:  04/10/23 134/86  02/20/23 116/76  02/19/23 115/71     Physical Exam Vitals and nursing note reviewed.  Constitutional:      Appearance: Normal appearance. She is obese.  Eyes:     Extraocular Movements: Extraocular movements intact.     Conjunctiva/sclera: Conjunctivae normal.     Pupils: Pupils are equal, round, and reactive to light.  Cardiovascular:     Rate and Rhythm: Normal rate and regular rhythm.  Pulmonary:     Effort: Pulmonary effort is normal.     Breath sounds: Normal breath sounds.  Neurological:     General: No focal deficit present.     Mental Status: She is alert and oriented to person, place, and time.  Psychiatric:        Mood and Affect: Mood normal.        Behavior: Behavior normal.        Thought Content: Thought content normal.         Kalla Watson M Justo Hengel, PA-C

## 2023-05-03 DIAGNOSIS — G4733 Obstructive sleep apnea (adult) (pediatric): Secondary | ICD-10-CM | POA: Diagnosis not present

## 2023-05-25 ENCOUNTER — Ambulatory Visit

## 2023-05-25 ENCOUNTER — Telehealth: Admitting: Nurse Practitioner

## 2023-05-25 DIAGNOSIS — R3989 Other symptoms and signs involving the genitourinary system: Secondary | ICD-10-CM | POA: Diagnosis not present

## 2023-05-25 MED ORDER — CEPHALEXIN 500 MG PO CAPS: 500.0000 mg | ORAL_CAPSULE | Freq: Two times a day (BID) | ORAL | 0 refills | Status: AC

## 2023-05-25 NOTE — Progress Notes (Signed)

## 2023-06-01 ENCOUNTER — Ambulatory Visit
Admission: RE | Admit: 2023-06-01 | Discharge: 2023-06-01 | Disposition: A | Discharge status: Home / Self Care | Source: Ambulatory Visit | Attending: Family Medicine | Admitting: Family Medicine

## 2023-06-01 ENCOUNTER — Other Ambulatory Visit: Payer: Self-pay

## 2023-06-01 VITALS — BP 118/77 | HR 85 | Temp 97.9°F | Resp 18

## 2023-06-01 DIAGNOSIS — K5900 Constipation, unspecified: Secondary | ICD-10-CM | POA: Diagnosis not present

## 2023-06-01 DIAGNOSIS — R1032 Left lower quadrant pain: Secondary | ICD-10-CM | POA: Diagnosis not present

## 2023-06-01 DIAGNOSIS — Z3202 Encounter for pregnancy test, result negative: Secondary | ICD-10-CM | POA: Diagnosis not present

## 2023-06-01 LAB — POCT URINALYSIS DIP (MANUAL ENTRY)
Blood, UA: NEGATIVE
Glucose, UA: NEGATIVE mg/dL
Leukocytes, UA: NEGATIVE
Nitrite, UA: NEGATIVE
Protein Ur, POC: 30 mg/dL — AB
Spec Grav, UA: >=1.03 — AB (ref 1.010–1.025)
Urobilinogen, UA: 1 U/dL
pH, UA: 5.5 (ref 5.0–8.0)

## 2023-06-01 LAB — POCT URINE PREGNANCY: Preg Test, Ur: NEGATIVE

## 2023-06-01 MED ORDER — DOCUSATE SODIUM 100 MG PO CAPS
100.0000 mg | ORAL_CAPSULE | Freq: Every day | ORAL | 0 refills | Status: AC
Start: 2023-06-01 — End: ?

## 2023-06-01 MED ORDER — POLYETHYLENE GLYCOL 3350 17 GM/SCOOP PO POWD: 17.0000 g | Freq: Every day | ORAL | 0 refills | Status: AC | PRN

## 2023-06-01 NOTE — Discharge Instructions (Signed)
Your abdominal pain/physical exam findings are consistent with constipation. Start taking MiraLAX 1-2 times daily (12 hours between doses) until you are able to have a soft normal bowel movement.  Once you are able to have a soft normal bowel movement, use Miralax only once a day for 1-2 days, then as needed.  Increase the amount of fiber you are eating by eating more fruits, vegetables, and whole grains.  Increase your water intake to at least 8 cups of water per day to maintain good hydration.  Take Colace stool softener daily to prevent constipation in the future. You may purchase colace over the counter and take this once daily to help keep stools soft.   If you have not had a bowel movement in the next 2 to 3 days, please return to urgent care.  If you develop any new or worsening symptoms that are severe, please go to the emergency room for further evaluation.

## 2023-06-01 NOTE — ED Triage Notes (Signed)
 Pt states that she left lower quad pain that was worse with BM. Pt states she was treated for a bladder infection x 1 week ago, has finished abx, and still has pain.

## 2023-06-01 NOTE — ED Provider Notes (Signed)
 [facility]    CSN: [id number] Arrival date & time: 06/01/23  1146      History   Chief Complaint Chief Complaint  Patient presents with   Abdominal Pain    Pain in lower abdomen on left side near bladder. Was treated for a bladder infection and finished all of medicine, but pain is still consistent. - Entered by patient    HPI Sharon Stone is a 33 y.o. female.   Sharon Stone is a 33 y.o. female presenting for chief complaint of abdominal pain to the left lower quadrant that started 6-7 days ago. Pain is localized to the left lower quadrant abdomen, does not radiate, and improves slightly after having bowel movement.  Pain was initially associated with urinary frequency, she did an e-visit where she was prescribed Keflex  for empiric treatment of UTI due to history of frequent urinary tract infections.  She took Keflex  antibiotic twice daily for 7 days and states her urinary symptoms have fully resolved but the pain to the left lower quadrant persists.  Pain is described as a pressure sensation that comes and goes.  She takes Wegovy  for weight loss and wonders if the Wegovy  is causing some constipation.  She reports history of constipation and irregular bowel movements at baseline.  Last normal bowel movement was 4 days ago and without blood/mucus in the stools.  She has the urge to have a bowel movement, however states she is "unable to get anything out" when she sits on the toilet.  Denies nausea, vomiting, fever, chills, flank pain, urinary symptoms, vaginal symptoms, dizziness, headaches, and diarrhea.  History of cesarean section in 2016, otherwise no history of abdominal surgeries.  Last menstrual cycle was May 07, 2023.  Denies chance of pregnancy.  She has not attempted use of any over-the-counter medications to help with symptoms prior to arrival.   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 05/15/2022   Insulin  resistance 04/24/2022   Other fatigue 04/23/2022   SOB (shortness of breath) on exertion 04/23/2022   Vitamin D  deficiency 04/23/2022   Elevated glucose 04/23/2022   Health care maintenance 04/23/2022   Vitamin B 12 deficiency 04/23/2022   Morbid obesity (HCC) 04/23/2022   BMI 50.0-59.9, adult (HCC) 04/23/2022   Uterine leiomyoma 03/21/2022   Cigarette nicotine  dependence without complication 02/25/2022   Gastroesophageal reflux disease without esophagitis 02/25/2022   Marijuana abuse 05/31/2016   History of VBAC 05/31/2016   Gestational thrombocytopenia (HCC) 05/31/2016   Gestational hypertension 05/31/2016   Fetal demise, greater than 22 weeks, antepartum 05/28/2016   Tobacco smoking affecting pregnancy    Morbid obesity due to excess calories [facility])     Past Surgical History:  Procedure Laterality Date   CESAREAN SECTION N/A 12/02/2014   Procedure: CESAREAN SECTION;  Surgeon: Albino Hum, MD;  Location: [facility];  Service: Obstetrics;  Laterality: N/A;   WISDOM TOOTH EXTRACTION  2010    OB History  Gravida  3   Para  3   Term  2   Preterm  1   AB      Living  2      SAB      IAB      Ectopic      Multiple  0   Live Births  2            Home Medications    Prior to Admission medications   Medication Sig Start Date End Date Taking? Authorizing Provider  docusate sodium  (COLACE) 100 MG capsule Take 1 capsule (100 mg total) by mouth daily. 06/01/23  Yes Sharon Eaton,  FNP  polyethylene glycol powder (MIRALAX) 17 GM/SCOOP powder Take 17 g by mouth daily as needed. 06/01/23  Yes Sharon Eaton, FNP  acetaminophen  (TYLENOL ) 500 MG tablet Take 1,000 mg by mouth every 6 (six) hours as needed for headache.    [provider]  acetaZOLAMIDE ER (DIAMOX) 500 MG capsule Take 500 mg by mouth 2 (two) times daily. 10/27/22   [provider]  albuterol  (VENTOLIN  HFA) 108 (90 Base) MCG/ACT inhaler Inhale 2 puffs into the lungs every 6 (six) hours as needed for wheezing or shortness of breath. 12/15/22   Stone, Sharon M, PA-C  Albuterol -Budesonide  (AIRSUPRA ) 90-80 MCG/ACT AERO Inhale 2 puffs into the lungs every 6 (six) hours as needed. 12/09/22   Stone, Sharon M, PA-C  cephALEXin  (KEFLEX ) 500 MG capsule Take 1 capsule (500 mg total) by mouth 2 (two) times daily for 7 days. 05/25/23 06/01/23  Mardene Shake, FNP  Cyanocobalamin  (B-12 COMPLIANCE INJECTION) 1000 MCG/ML KIT Inject 1,000 mcg as directed once a week. 04/23/22   Stone, Sharon M, PA-C  folic acid (FOLVITE) 1 MG tablet Take 1 mg by mouth daily.    [provider]  ibuprofen  (ADVIL ,MOTRIN ) 600 MG tablet Take 1 tablet (600 mg total) by mouth every 6 (six) hours as needed. 05/30/16   Sharon Caller, MD  nicotine  (NICODERM CQ  - DOSED IN MG/24 HOURS) 21 mg/24hr patch Place 1 patch (21 mg total) onto the skin daily. 02/11/23   Sharon Kelp, PA-C  Relugolix-Estradiol-Norethind (MYFEMBREE) 40-1-0.5 MG TABS Take 1 tablet by mouth daily.    [provider]  Semaglutide -Weight Management (WEGOVY ) 1 MG/0.5ML SOAJ Inject 1 mg into the skin every 7 (seven) days. 04/10/23   Stone, Sharon Felix, PA-C    Family History Family History  Problem Relation Age of Onset   Cancer Mother    Depression Mother    Anxiety disorder Mother    Bipolar disorder Mother    Drug abuse Mother    Heart disease Maternal Grandmother    Diabetes Maternal Grandmother    Breast cancer Maternal Grandmother     Diabetes Maternal Aunt     Social History Social History   Tobacco Use   Smoking status: Every Day    Current packs/day: 0.25    Types: Cigarettes   Smokeless tobacco: Current  Vaping Use   Vaping status: Never Used  Substance Use Topics   Alcohol use: Yes   Drug use: Yes    Types: Marijuana    Comment: pt describes herself as "pothead"     Allergies   Shellfish allergy   Review of Systems Review of Systems  Gastrointestinal:  Positive for abdominal pain.  Per HPI   Physical Exam Triage Vital Signs ED Triage Vitals  Encounter Vitals Group     BP 06/01/23 1155 118/77  Systolic BP Percentile --      Diastolic BP Percentile --      Pulse Rate 06/01/23 1155 85     Resp 06/01/23 1155 18     Temp 06/01/23 1155 97.9 F (36.6 C)     Temp Source 06/01/23 1155 Oral     SpO2 06/01/23 1155 99 %     Weight --      Height --      Head Circumference --      Peak Flow --      Pain Score 06/01/23 1154 4     Pain Loc --      Pain Education --      Exclude from Growth Chart --    No data found.  Updated Vital Signs BP 118/77 (BP Location: Right Arm)   Pulse 85   Temp 97.9 F (36.6 C) (Oral)   Resp 18   LMP 05/07/2023 (Exact Date)   SpO2 99%   Visual Acuity Right Eye Distance:   Left Eye Distance:   Bilateral Distance:    Right Eye Near:   Left Eye Near:    Bilateral Near:     Physical Exam Vitals and nursing note reviewed.  Constitutional:      Appearance: She is not ill-appearing or toxic-appearing.  HENT:     Head: Normocephalic and atraumatic.     Right Ear: Hearing and external ear normal.     Left Ear: Hearing and external ear normal.     Nose: Nose normal.     Mouth/Throat:     Lips: Pink.  Eyes:     General: Lids are normal. Vision grossly intact. Gaze aligned appropriately.     Extraocular Movements: Extraocular movements intact.     Conjunctiva/sclera: Conjunctivae normal.  Cardiovascular:     Rate and Rhythm: Normal rate and  regular rhythm.     Heart sounds: Normal heart sounds, S1 normal and S2 normal.  Pulmonary:     Effort: Pulmonary effort is normal. No respiratory distress.     Breath sounds: Normal breath sounds and air entry.  Abdominal:     General: Abdomen is flat. Bowel sounds are normal. There is no distension. There are no signs of injury.     Palpations: Abdomen is soft.     Tenderness: There is abdominal tenderness in the periumbilical area, left upper quadrant and left lower quadrant. There is no right CVA tenderness, left CVA tenderness, guarding or rebound. Negative signs include Murphy's sign and McBurney's sign.     Comments: No guarding, no peritoneal signs on exam.   Musculoskeletal:     Cervical back: Neck supple.  Skin:    General: Skin is warm and dry.     Capillary Refill: Capillary refill takes less than 2 seconds.     Findings: No rash.  Neurological:     General: No focal deficit present.     Mental Status: She is alert and oriented to person, place, and time. Mental status is at baseline.     Cranial Nerves: No dysarthria or facial asymmetry.  Psychiatric:        Mood and Affect: Mood normal.        Speech: Speech normal.        Behavior: Behavior normal.        Thought Content: Thought content normal.        Judgment: Judgment normal.      UC Treatments / Results  Labs (all labs ordered are  listed, but only abnormal results are displayed) Labs Reviewed  POCT URINALYSIS DIP (MANUAL ENTRY) - Abnormal; Notable for the following components:      Result Value   Bilirubin, UA small (*)    Ketones, POC UA trace (5) (*)    Spec Grav, UA >=1.030 (*)    Protein Ur, POC =30 (*)    All other components within normal limits  POCT URINE PREGNANCY - Normal    EKG   Radiology No results found.  Procedures Procedures (including critical care time)  Medications Ordered in UC Medications - No data to display  Initial Impression / Assessment and Plan / UC Course  I have  reviewed the triage vital signs and the nursing notes.  Pertinent labs & imaging results that were available during my care of the patient were reviewed by me and considered in my medical decision making (see chart for details).   1.  Constipation Presentation consistent with constipation. Will manage this with Miralax as directed, daily stool softener, and increased water/fiber intake. No peritoneal signs to abdominal exam. No indication for imaging of the abdomen, low suspicion for obstruction etiology.  Return to clinic or go to ER if no bowel movement production in 24-48 hours.   Urine pregnancy negative, urinalysis unremarkable for signs of UTI. UA shows elevated specific gravity and signs of dehydration, further encouraged adequate water intake especially while taking Miralax for constipation.   Counseled patient on potential for adverse effects with medications prescribed/recommended today, strict ER and return-to-clinic precautions discussed, patient verbalized understanding.    Final Clinical Impressions(s) / UC Diagnoses   Final diagnoses:  Constipation, unspecified constipation type  Urine pregnancy test negative  Abdominal pain, left lower quadrant     Discharge Instructions      Your abdominal pain/physical exam findings are consistent with constipation. Start taking MiraLAX 1-2 times daily (12 hours between doses) until you are able to have a soft normal bowel movement.  Once you are able to have a soft normal bowel movement, use Miralax only once a day for 1-2 days, then as needed.  Increase the amount of fiber you are eating by eating more fruits, vegetables, and whole grains.  Increase your water intake to at least 8 cups of water per day to maintain good hydration.  Take Colace stool softener daily to prevent constipation in the future. You may purchase colace over the counter and take this once daily to help keep stools soft.   If you have not had a bowel movement in  the next 2 to 3 days, please return to urgent care.  If you develop any new or worsening symptoms that are severe, please go to the emergency room for further evaluation.        ED Prescriptions     Medication Sig Dispense Auth. Provider   polyethylene glycol powder (MIRALAX) 17 GM/SCOOP powder Take 17 g by mouth daily as needed. 255 g Sharon Stone M, FNP   docusate sodium  (COLACE) 100 MG capsule Take 1 capsule (100 mg total) by mouth daily. 30 capsule Sharon Eaton, FNP      PDMP not reviewed this encounter.   Sharon Stone, [place] 06/01/23 1241

## 2023-06-02 DIAGNOSIS — G4733 Obstructive sleep apnea (adult) (pediatric): Secondary | ICD-10-CM | POA: Diagnosis not present

## 2023-06-27 ENCOUNTER — Encounter: Payer: Self-pay | Admitting: Physician Assistant

## 2023-06-29 NOTE — Telephone Encounter (Signed)
 Please advise if ok for pt to complete visit virtually

## 2023-07-02 DIAGNOSIS — G4733 Obstructive sleep apnea (adult) (pediatric): Secondary | ICD-10-CM | POA: Diagnosis not present

## 2023-07-03 DIAGNOSIS — G4733 Obstructive sleep apnea (adult) (pediatric): Secondary | ICD-10-CM | POA: Diagnosis not present

## 2023-07-06 ENCOUNTER — Telehealth: Payer: Self-pay

## 2023-07-06 ENCOUNTER — Other Ambulatory Visit (HOSPITAL_COMMUNITY): Payer: Self-pay

## 2023-07-06 ENCOUNTER — Encounter: Payer: Self-pay | Admitting: Physician Assistant

## 2023-07-06 ENCOUNTER — Telehealth (INDEPENDENT_AMBULATORY_CARE_PROVIDER_SITE_OTHER): Admitting: Physician Assistant

## 2023-07-06 DIAGNOSIS — F17200 Nicotine dependence, unspecified, uncomplicated: Secondary | ICD-10-CM | POA: Diagnosis not present

## 2023-07-06 DIAGNOSIS — Z6841 Body Mass Index (BMI) 40.0 and over, adult: Secondary | ICD-10-CM

## 2023-07-06 MED ORDER — WEGOVY 1.7 MG/0.75ML ~~LOC~~ SOAJ
1.7000 mg | SUBCUTANEOUS | 2 refills | Status: DC
  Filled 2023-07-06 – 2023-07-07 (×2): qty 3, 28d supply, fill #0
  Filled 2023-08-04: qty 3, 28d supply, fill #1
  Filled 2023-09-07 – 2023-10-13 (×4): qty 3, 28d supply, fill #2

## 2023-07-06 NOTE — Telephone Encounter (Signed)
 Pharmacy Patient Advocate Encounter   Received notification from Onbase that prior authorization for Wegovy  1.7MG /0.75ML auto-injectors is required/requested.   Insurance verification completed.   The patient is insured through [facility] .   Per test claim: PA required; PA submitted to above mentioned insurance via CoverMyMeds Key/confirmation #/EOC [id number] Status is pending

## 2023-07-06 NOTE — Progress Notes (Signed)
 Virtual Visit via Video Note  I connected with  Sharon Stone  on 07/06/23 at  1:00 PM EDT by a video enabled telemedicine application and verified that I am speaking with the correct person using two identifiers.  Location: Patient: home Provider: Nature conservation officer at [employer] Persons present: Patient and myself   I discussed the limitations of evaluation and management by telemedicine and the availability of in person appointments. The patient expressed understanding and agreed to proceed.   History of Present Illness:  Discussed the use of AI scribe software for clinical note transcription with the patient, who gave verbal consent to proceed.  History of Present Illness Sharon Stone is a 33 year old female who presents for a virtual follow-up on her weight loss journey.  She has been using Wegovy  at a dose of one milligram every seven days. Since her last visit in March, she has lost a total of 31 pounds and has improved energy levels. She recently weighed herself and noted that she is now under 300 pounds, achieving a significant milestone in her weight loss journey. Her appetite has increased slightly but is still manageable compared to before starting Wegovy . She experiences some nausea, but it is not unbearable.  She is working on smoking cessation. Initially, she was using nicotine  patches, but after her mother's passing from lung cancer, she resumed smoking. She is now making efforts to quit again and aims to be smoke-free by her son's birthday in November. Her mother's unexpected passing at the age of 59 from lung cancer, which metastasized to the brain, has been a significant emotional event for her.  In terms of physical activity, she exercises about twice a week and is trying to increase this to four times a week. She finds movement easier and notices a difference in her knees.     Observations/Objective:   Gen: Awake, alert, no acute  distress Resp: Breathing is even and non-labored Psych: calm/pleasant demeanor Neuro: Alert and Oriented x 3, + facial symmetry, speech is clear.   Assessment and Plan:  Assessment and Plan Assessment & Plan Obesity Currently on Wegovy  1 mg weekly, resulting in significant weight loss from 350 lbs in March last year to 290 lbs today. Reports improved energy and reduced knee strain. Tolerates medication well with mild nausea. Appetite slightly increased but controlled. Considering increasing Wegovy  to 1.7 mg for further weight loss, with the option to revert if not well-tolerated. - Increase Wegovy  dose to 1.7 mg weekly and send prescription to [facility] pharmacy. - Schedule follow-up appointment in 3 months to assess progress and medication tolerance.  Smoking Cessation Previously used nicotine  patches but relapsed after mother's death from lung cancer. Actively working towards quitting with a goal to be smoke-free by son's birthday on November 11th. Highly motivated with a personal deadline. - Support smoking cessation efforts. - Encourage setting a quit date and provide resources as needed.  General Health Maintenance Incorporating more exercise, currently twice a week, aiming for four times weekly. Making lifestyle changes to support weight loss and overall health. - Encourage increasing physical activity to four times a week. - Provide support and resources for maintaining a healthy lifestyle.    Follow Up Instructions:    I discussed the assessment and treatment plan with the patient. The patient was provided an opportunity to ask questions and all were answered. The patient agreed with the plan and demonstrated an understanding of the instructions.   The patient was advised  to call back or seek an in-person evaluation if the symptoms worsen or if the condition fails to improve as anticipated.  Sharon Stone M Sharon Mccormick, PA-C

## 2023-07-07 ENCOUNTER — Other Ambulatory Visit (HOSPITAL_COMMUNITY): Payer: Self-pay

## 2023-07-07 NOTE — Telephone Encounter (Signed)
 Pharmacy Patient Advocate Encounter  Received notification from [facility] that Prior Authorization for WEGOVY  has been APPROVED from 07/06/23 to 07/05/24   PA #/Case ID/Reference #: [id number]

## 2023-07-07 NOTE — Telephone Encounter (Signed)
 Called pt and advise PA approval; pt contacting pharmacy to pick up Rx later today.

## 2023-07-13 ENCOUNTER — Ambulatory Visit: Admitting: Physician Assistant

## 2023-08-04 DIAGNOSIS — Z01419 Encounter for gynecological examination (general) (routine) without abnormal findings: Secondary | ICD-10-CM | POA: Diagnosis not present

## 2023-08-04 DIAGNOSIS — N920 Excessive and frequent menstruation with regular cycle: Secondary | ICD-10-CM | POA: Insufficient documentation

## 2023-08-04 DIAGNOSIS — Z113 Encounter for screening for infections with a predominantly sexual mode of transmission: Secondary | ICD-10-CM | POA: Diagnosis not present

## 2023-08-04 DIAGNOSIS — Z124 Encounter for screening for malignant neoplasm of cervix: Secondary | ICD-10-CM | POA: Diagnosis not present

## 2023-08-06 ENCOUNTER — Telehealth: Admitting: Physician Assistant

## 2023-08-06 ENCOUNTER — Encounter: Payer: Self-pay | Admitting: Physician Assistant

## 2023-08-06 VITALS — Ht 65.0 in | Wt 290.0 lb

## 2023-08-06 DIAGNOSIS — F4321 Adjustment disorder with depressed mood: Secondary | ICD-10-CM | POA: Diagnosis not present

## 2023-08-06 DIAGNOSIS — F4329 Adjustment disorder with other symptoms: Secondary | ICD-10-CM

## 2023-08-06 NOTE — Telephone Encounter (Signed)
 Please see pt msg and advise if you would like pt to schedule an appt

## 2023-08-06 NOTE — Progress Notes (Signed)
 Virtual Visit via Video Note  I connected with  Sharon Stone  on 08/06/23 at  2:00 PM EDT by a video enabled telemedicine application and verified that I am speaking with the correct person using two identifiers.  Location: Patient: home Provider: Nature conservation officer at [employer] Persons present: Patient and myself   I discussed the limitations of evaluation and management by telemedicine and the availability of in person appointments. The patient expressed understanding and agreed to proceed.   History of Present Illness:  Discussed the use of AI scribe software for clinical note transcription with the patient, who gave verbal consent to proceed.  History of Present Illness Sharon Stone is a 33 year old female who presents with active grief and depression following the recent loss of her mother.  She reports significant emotional distress following the recent loss of her 22 year old mother to metastatic lung cancer. She has missed workdays and is seeking intermittent leave under FMLA to attend counseling sessions and take days off when her grief and depression are severe.  She has irregular sleep patterns, sometimes staying awake until 3 or 4 AM and waking up at 6 or 7 AM, while on other days, she goes to bed as early as 8 PM after work.  She has a strong support system at home, including her son's father, siblings, and grandmothers, who assist with daily responsibilities and provide emotional support. She consumes alcohol only occasionally in social settings.  She spends more time outdoors, which she finds beneficial for her mental health, and enjoys spending breaks outside on her new patio set.  She completed a PHQ-9 with a score of 5. She is not currently on any medication for her mental health and prefers therapy as her primary treatment option.     Observations/Objective:   Gen: Awake, alert, no acute distress Resp: Breathing is even and  non-labored Psych: calm/pleasant demeanor Neuro: Alert and Oriented x 3, + facial symmetry, speech is clear.   Assessment and Plan:  Assessment & Plan Grief and Depression She is experiencing active grief and mild depression following the recent loss of her 44 year old mother to metastatic lung cancer. She reports difficulty sleeping, with some nights staying awake until 3-4 AM and other nights falling asleep early. She has a strong support system from family and friends and is seeking counseling to manage her symptoms. Her PHQ score is 5, indicating mild depression. She prefers to start with therapy rather than medication at this time. Informed consent was obtained regarding the benefits of therapy as a first-line treatment, with the understanding that medication may be considered if symptoms worsen. - Initiate FMLA paperwork for intermittent leave, allowing up to two days per week as needed for mental health concerns and counseling appointments. Starting date of today, 08/06/23, to be continued for the next 12 months. - Encourage her to contact the [facility] at [facility] for counseling services. - Advise her to explore therapy options and keep the provider updated on her progress. - Maintain the current follow-up appointment in September to reassess her condition and needs.    Follow Up Instructions:    I discussed the assessment and treatment plan with the patient. The patient was provided an opportunity to ask questions and all were answered. The patient agreed with the plan and demonstrated an understanding of the instructions.   The patient was advised to call back or seek an in-person evaluation if the symptoms worsen or if the condition fails to improve  as anticipated.  [name] Peery M Donn Zanetti, PA-C

## 2023-09-16 ENCOUNTER — Other Ambulatory Visit (HOSPITAL_COMMUNITY): Payer: Self-pay

## 2023-09-30 ENCOUNTER — Other Ambulatory Visit (HOSPITAL_COMMUNITY): Payer: Self-pay

## 2023-09-30 DIAGNOSIS — G4733 Obstructive sleep apnea (adult) (pediatric): Secondary | ICD-10-CM | POA: Diagnosis not present

## 2023-10-06 ENCOUNTER — Encounter: Payer: Self-pay | Admitting: Physician Assistant

## 2023-10-06 ENCOUNTER — Ambulatory Visit (INDEPENDENT_AMBULATORY_CARE_PROVIDER_SITE_OTHER): Admitting: Physician Assistant

## 2023-10-06 VITALS — BP 114/68 | HR 88 | Temp 97.5°F | Ht 65.0 in | Wt 267.6 lb

## 2023-10-06 DIAGNOSIS — F4321 Adjustment disorder with depressed mood: Secondary | ICD-10-CM | POA: Diagnosis not present

## 2023-10-06 DIAGNOSIS — Z6841 Body Mass Index (BMI) 40.0 and over, adult: Secondary | ICD-10-CM | POA: Diagnosis not present

## 2023-10-06 DIAGNOSIS — Z720 Tobacco use: Secondary | ICD-10-CM | POA: Diagnosis not present

## 2023-10-06 NOTE — Progress Notes (Signed)
 Patient ID: Sharon Stone, female    DOB: [birth year], 33 y.o.   MRN: [id number]   Assessment & Plan:  Tobacco abuse  Morbid obesity due to excess calories (HCC)  BMI 45.0-49.9, adult (HCC)     Assessment and Plan Assessment & Plan Morbid obesity due to excess calories She has been using Wegovy  for weight management with significant success, reducing her weight from 344 pounds to 267 pounds over the past year. However, her insurance will no longer cover Wegovy  after October 1st. She has developed healthy eating habits and portion control, which she plans to continue. Discussed the potential need for alternative weight management strategies, including lifestyle modifications and possibly engaging with a healthy weight and wellness program. - Continue current Wegovy  until insurance coverage ends - Encourage continued portion control and healthy eating habits - Consider referral to healthy weight and wellness program if needed  Tobacco use She is motivated to quit smoking and has been attempting to reduce her tobacco use. She has managed to abstain for up to two weeks at a time. Discussed behavioral strategies for smoking cessation, such as finding alternative activities to distract from cravings. Pharmacological options like Chantix and Wellbutrin  were mentioned as potential aids if behavioral strategies are insufficient. - Encourage use of behavioral strategies for smoking cessation - Consider pharmacological aids like Chantix or Wellbutrin  if needed  Grief She is experiencing grief following the loss of her mother to metastatic lung cancer. She is utilizing FMLA for grief support meetings, which she finds beneficial. Her mood is improving, and she reports feeling better and more stable. - Continue attending grief support meetings      Return in about 4 months (around 02/05/2024) for recheck/follow-up.    Subjective:    Chief Complaint  Patient presents with   Medical  Management of Chronic Issues    Pt in office for 3 month f/u with PCP; pt states no concerns to discuss     HPI Discussed the use of AI scribe software for clinical note transcription with the patient, who gave verbal consent to proceed.  History of Present Illness Sharon Stone is a 33 year old female who presents for follow-up on grief support and weight management.  She has been attending grief support group sessions at her church and a friend's church on Mondays and Tuesdays, which she finds very helpful. She uses her FMLA days to attend these sessions, as they provide her with comfort and support after the loss of her mother. Her mood is improving, and she feels happy to wake up in the morning. She is eating and sleeping well, and her spirits are generally good.  She has been using Wegovy  for weight management for almost a year. She started at 344 pounds and is currently 267 pounds, indicating significant weight loss. She has developed better eating habits and portion control over time, which she believes will help her maintain her progress.  She is working on smoking cessation and has managed to go up to two weeks without smoking. She is motivated to quit and is trying to find alternative activities to distract herself from the urge to smoke, as advised by her grandmother who successfully quit smoking.  Her son has been experiencing coughing, which she suspects is due to mold in her apartment, as he is allergic to mold.     Past Medical History:  Diagnosis Date   Allergy    Anxiety    Asthma    Depression  GERD (gastroesophageal reflux disease)    Health care maintenance 04/23/2022   Multiple food allergies     Past Surgical History:  Procedure Laterality Date   CESAREAN SECTION N/A 12/02/2014   Procedure: CESAREAN SECTION;  Surgeon: Norleen Edsel GAILS, MD;  Location: [facility];  Service: Obstetrics;  Laterality: N/A;   WISDOM TOOTH EXTRACTION  2010    Family  History  Problem Relation Age of Onset   Lung cancer Mother    Depression Mother    Anxiety disorder Mother    Bipolar disorder Mother    Drug abuse Mother    Heart disease Maternal Grandmother    Diabetes Maternal Grandmother    Breast cancer Maternal Grandmother    Diabetes Maternal Aunt     Social History   Tobacco Use   Smoking status: Every Day    Current packs/day: 0.25    Types: Cigarettes   Smokeless tobacco: Current  Vaping Use   Vaping status: Never Used  Substance Use Topics   Alcohol use: Yes   Drug use: Yes    Types: Marijuana    Comment: pt describes herself as pothead     Allergies  Allergen Reactions   Shellfish Allergy Anaphylaxis    Review of Systems NEGATIVE UNLESS OTHERWISE INDICATED IN HPI      Objective:     BP 114/68 (BP Location: Left Arm, Patient Position: Sitting, Cuff Size: Large)   Pulse 88   Temp (!) 97.5 F (36.4 C) (Temporal)   Ht 5' 5 (1.651 m)   Wt 267 lb 9.6 oz (121.4 kg)   SpO2 99%   BMI 44.53 kg/m   Wt Readings from Last 3 Encounters:  10/06/23 267 lb 9.6 oz (121.4 kg)  08/06/23 290 lb (131.5 kg)  07/06/23 290 lb (131.5 kg)    BP Readings from Last 3 Encounters:  10/06/23 114/68  06/01/23 118/77  04/10/23 134/86     Physical Exam Vitals and nursing note reviewed.  Constitutional:      Appearance: Normal appearance. She is obese.  Eyes:     Extraocular Movements: Extraocular movements intact.     Conjunctiva/sclera: Conjunctivae normal.     Pupils: Pupils are equal, round, and reactive to light.  Cardiovascular:     Rate and Rhythm: Normal rate and regular rhythm.  Pulmonary:     Effort: Pulmonary effort is normal.     Breath sounds: Normal breath sounds.  Skin:    Findings: No lesion or rash.  Neurological:     General: No focal deficit present.     Mental Status: She is alert and oriented to person, place, and time.  Psychiatric:        Mood and Affect: Mood normal.        Behavior: Behavior  normal.        Thought Content: Thought content normal.             Jarome Trull M Davionne Dowty, PA-C

## 2023-10-12 ENCOUNTER — Other Ambulatory Visit (HOSPITAL_COMMUNITY): Payer: Self-pay

## 2023-12-29 DIAGNOSIS — G4733 Obstructive sleep apnea (adult) (pediatric): Secondary | ICD-10-CM | POA: Diagnosis not present

## 2024-02-09 ENCOUNTER — Ambulatory Visit: Admitting: Physician Assistant

## 2024-02-10 ENCOUNTER — Encounter: Payer: Self-pay | Admitting: Physician Assistant

## 2024-02-10 NOTE — Telephone Encounter (Signed)
 Please review and advise. Tks

## 2024-02-18 ENCOUNTER — Other Ambulatory Visit: Payer: Self-pay

## 2024-02-26 ENCOUNTER — Ambulatory Visit (INDEPENDENT_AMBULATORY_CARE_PROVIDER_SITE_OTHER): Admitting: Physician Assistant

## 2024-02-26 ENCOUNTER — Telehealth (HOSPITAL_COMMUNITY): Payer: Self-pay

## 2024-02-26 ENCOUNTER — Telehealth (HOSPITAL_COMMUNITY): Payer: Self-pay | Admitting: Pharmacy Technician

## 2024-02-26 ENCOUNTER — Encounter: Payer: Self-pay | Admitting: Physician Assistant

## 2024-02-26 ENCOUNTER — Other Ambulatory Visit (HOSPITAL_COMMUNITY): Payer: Self-pay

## 2024-02-26 DIAGNOSIS — F4321 Adjustment disorder with depressed mood: Secondary | ICD-10-CM

## 2024-02-26 MED ORDER — WEGOVY 0.25 MG/0.5ML ~~LOC~~ SOAJ
0.2500 mg | SUBCUTANEOUS | 1 refills | Status: AC
  Filled 2024-02-26 – 2024-02-29 (×2): qty 2, 28d supply, fill #0

## 2024-02-26 NOTE — Telephone Encounter (Signed)
 PA request has been Received. New Encounter has been or will be created for follow up. For additional info see Pharmacy Prior Auth telephone encounter from 02/26/24.

## 2024-02-26 NOTE — Progress Notes (Signed)
 "   Patient ID: Sharon Stone, female    DOB: December 20, 1990, 34 y.o.   MRN: [id number]   Assessment & Plan:  Morbid obesity due to excess calories (HCC) -     Wegovy ; Inject 0.25 mg into the skin every 7 (seven) days.  Dispense: 2 mL; Refill: 1  Grief     Assessment & Plan Morbid obesity due to excess calories Morbid obesity with a BMI of 47.36. Previously on Wegovy  with good results, but insurance coverage was discontinued. Weight has decreased from 324 lbs in January last year to 284 lbs currently. Improved dietary habits, including reduced soda intake and healthier snacking. Continues to struggle with snacking habits. - Restart Wegovy  at the starting dose using subcutaneous pen due to insurance coverage resumption. - Encouraged continued lifestyle changes, including healthier snacking habits. - Sent prescription to community pharmacy with a refill option.  Grief Following the passing of her mother, who died of lung cancer. Previously experiencing grief but currently doing better. Continues counseling without medication. - Continue counseling for grief management.  General Health Maintenance Due for an annual physical and fasting labs. Weight loss has improved respiratory symptoms, reducing the need for an inhaler. - Will schedule annual physical and fasting labs in April.      Return in about 3 months (around 05/26/2024) for physical, fasting labs .    Subjective:    Chief Complaint  Patient presents with   Grief     Pt in office for follow up on grief after passing of her Mom; pt states things are getting better and she is doing ok; no concerns to discuss    HPI Discussed the use of AI scribe software for clinical note transcription with the patient, who gave verbal consent to proceed.  History of Present Illness Sharon Stone is a 34 year old female who presents for a follow-up regarding her weight management and medication coverage.  She was  previously on Wegovy  with positive results, but her insurance stopped covering it. Recently, she received notification that her insurance would cover it again, and she is eager to restart the medication.  During the holiday season, her routine was disrupted, affecting her weight management efforts. Despite this, she has developed better habits, such as reducing her [employer] intake and opting for water or diet soda. Snacking remains a challenge, but she feels she has made overall progress.  Her children help keep her accountable, particularly in avoiding soda.  She notes improved breathing and less frequent use of her inhaler during the winter, which she attributes to her weight loss.     Past Medical History:  Diagnosis Date   Allergy    Anxiety    Asthma    Depression    GERD (gastroesophageal reflux disease)    Health care maintenance 04/23/2022   Multiple food allergies     Past Surgical History:  Procedure Laterality Date   CESAREAN SECTION N/A 12/02/2014   Procedure: CESAREAN SECTION;  Surgeon: Norleen Edsel GAILS, MD;  Location: [facility];  Service: Obstetrics;  Laterality: N/A;   WISDOM TOOTH EXTRACTION  2010    Family History  Problem Relation Age of Onset   Lung cancer Mother    Depression Mother    Anxiety disorder Mother    Bipolar disorder Mother    Drug abuse Mother    Heart disease Maternal Grandmother    Diabetes Maternal Grandmother    Breast cancer Maternal Grandmother    Diabetes Maternal Aunt  Social History[1]   Allergies[2]  Review of Systems NEGATIVE UNLESS OTHERWISE INDICATED IN HPI      Objective:     BP 118/64 (BP Location: Left Arm, Patient Position: Sitting, Cuff Size: Large)   Pulse 86   Temp 98.4 F (36.9 C) (Temporal)   Ht 5' 5 (1.651 m)   Wt 284 lb 9.6 oz (129.1 kg)   LMP 02/15/2024 (Exact Date)   SpO2 99%   BMI 47.36 kg/m   Wt Readings from Last 3 Encounters:  02/26/24 284 lb 9.6 oz (129.1 kg)  10/06/23 267 lb 9.6 oz  (121.4 kg)  08/06/23 290 lb (131.5 kg)    BP Readings from Last 3 Encounters:  02/26/24 118/64  10/06/23 114/68  06/01/23 118/77     Physical Exam Vitals and nursing note reviewed.  Constitutional:      Appearance: Normal appearance. She is obese.  Eyes:     Extraocular Movements: Extraocular movements intact.     Conjunctiva/sclera: Conjunctivae normal.     Pupils: Pupils are equal, round, and reactive to light.  Cardiovascular:     Rate and Rhythm: Normal rate and regular rhythm.  Pulmonary:     Effort: Pulmonary effort is normal.     Breath sounds: Normal breath sounds.  Skin:    Findings: No lesion or rash.  Neurological:     General: No focal deficit present.     Mental Status: She is alert and oriented to person, place, and time.  Psychiatric:        Mood and Affect: Mood normal.        Behavior: Behavior normal.        Thought Content: Thought content normal.             Kellina Dreese M Arrietty Dercole, PA-C    [1]  Social History Tobacco Use   Smoking status: Every Day    Current packs/day: 0.25    Types: Cigarettes   Smokeless tobacco: Current  Vaping Use   Vaping status: Never Used  Substance Use Topics   Alcohol use: Yes   Drug use: Yes    Types: Marijuana    Comment: pt describes herself as pothead  [2]  Allergies Allergen Reactions   Shellfish Allergy Anaphylaxis   "

## 2024-02-26 NOTE — Telephone Encounter (Signed)
 Pharmacy Patient Advocate Encounter   Received notification from Pt Calls Messages that prior authorization for Wegovy  0.25MG /0.5ML auto-injectors  is required/requested.   Insurance verification completed.   The patient is insured through [employer].   Per test claim: PA required; PA submitted to above mentioned insurance via Latent Key/confirmation #/EOC [id number] Status is pending

## 2024-02-29 ENCOUNTER — Other Ambulatory Visit (HOSPITAL_COMMUNITY): Payer: Self-pay

## 2024-02-29 ENCOUNTER — Other Ambulatory Visit: Payer: Self-pay

## 2024-02-29 NOTE — Telephone Encounter (Signed)
 Pharmacy Patient Advocate Encounter  Received notification from [employer] that Prior Authorization for Wegovy  0.25MG /0.5ML auto-injectors  has been APPROVED from 02/26/24 to 02/25/25. Ran test claim, Copay is $4. This test claim was processed through [facility]- copay amounts may vary at other pharmacies due to pharmacy/plan contracts, or as the patient moves through the different stages of their insurance plan.   PA #/Case ID/Reference #: [id number]

## 2024-05-26 ENCOUNTER — Encounter: Admitting: Physician Assistant
